# Patient Record
Sex: Male | Born: 1970 | Race: White | Hispanic: No | Smoking: Current every day smoker
Health system: Southern US, Community
[De-identification: ages and names within clinical notes are randomized; demographics above are authoritative.]

## PROBLEM LIST (undated history)

## (undated) DIAGNOSIS — C801 Malignant (primary) neoplasm, unspecified: Secondary | ICD-10-CM

## (undated) DIAGNOSIS — G8929 Other chronic pain: Secondary | ICD-10-CM

## (undated) DIAGNOSIS — G4730 Sleep apnea, unspecified: Secondary | ICD-10-CM

## (undated) DIAGNOSIS — M549 Dorsalgia, unspecified: Secondary | ICD-10-CM

## (undated) HISTORY — PX: KNEE ARTHROSCOPY W/ ACL RECONSTRUCTION AND PATELLA GRAFT: SHX1861

## (undated) HISTORY — PX: KNEE ARTHROSCOPY: SUR90

---

## 1997-11-10 ENCOUNTER — Emergency Department (HOSPITAL_COMMUNITY): Admission: EM | Admit: 1997-11-10 | Discharge: 1997-11-10 | Payer: Self-pay | Admitting: Emergency Medicine

## 1998-08-31 ENCOUNTER — Emergency Department (HOSPITAL_COMMUNITY): Admission: EM | Admit: 1998-08-31 | Discharge: 1998-08-31 | Payer: Self-pay | Admitting: Emergency Medicine

## 1998-08-31 ENCOUNTER — Encounter: Payer: Self-pay | Admitting: Emergency Medicine

## 2001-01-07 ENCOUNTER — Emergency Department (HOSPITAL_COMMUNITY): Admission: EM | Admit: 2001-01-07 | Discharge: 2001-01-07 | Payer: Self-pay | Admitting: Emergency Medicine

## 2001-01-08 ENCOUNTER — Emergency Department (HOSPITAL_COMMUNITY): Admission: EM | Admit: 2001-01-08 | Discharge: 2001-01-08 | Payer: Self-pay | Admitting: Emergency Medicine

## 2001-01-10 ENCOUNTER — Emergency Department (HOSPITAL_COMMUNITY): Admission: EM | Admit: 2001-01-10 | Discharge: 2001-01-10 | Payer: Self-pay | Admitting: Emergency Medicine

## 2001-06-03 ENCOUNTER — Encounter: Payer: Self-pay | Admitting: Emergency Medicine

## 2001-06-03 ENCOUNTER — Emergency Department (HOSPITAL_COMMUNITY): Admission: EM | Admit: 2001-06-03 | Discharge: 2001-06-03 | Payer: Self-pay | Admitting: Emergency Medicine

## 2002-06-14 ENCOUNTER — Emergency Department (HOSPITAL_COMMUNITY): Admission: EM | Admit: 2002-06-14 | Discharge: 2002-06-14 | Payer: Self-pay | Admitting: Emergency Medicine

## 2003-11-16 ENCOUNTER — Emergency Department (HOSPITAL_COMMUNITY): Admission: EM | Admit: 2003-11-16 | Discharge: 2003-11-16 | Payer: Self-pay | Admitting: Emergency Medicine

## 2004-03-14 ENCOUNTER — Emergency Department (HOSPITAL_COMMUNITY): Admission: EM | Admit: 2004-03-14 | Discharge: 2004-03-14 | Payer: Self-pay | Admitting: Emergency Medicine

## 2004-08-06 ENCOUNTER — Emergency Department (HOSPITAL_COMMUNITY): Admission: EM | Admit: 2004-08-06 | Discharge: 2004-08-06 | Payer: Self-pay | Admitting: Emergency Medicine

## 2005-03-08 ENCOUNTER — Emergency Department (HOSPITAL_COMMUNITY): Admission: EM | Admit: 2005-03-08 | Discharge: 2005-03-08 | Payer: Self-pay | Admitting: Emergency Medicine

## 2006-09-17 ENCOUNTER — Emergency Department (HOSPITAL_COMMUNITY): Admission: EM | Admit: 2006-09-17 | Discharge: 2006-09-17 | Payer: Self-pay | Admitting: Emergency Medicine

## 2006-09-19 ENCOUNTER — Emergency Department (HOSPITAL_COMMUNITY): Admission: EM | Admit: 2006-09-19 | Discharge: 2006-09-19 | Payer: Self-pay | Admitting: Emergency Medicine

## 2007-06-02 ENCOUNTER — Emergency Department (HOSPITAL_COMMUNITY): Admission: EM | Admit: 2007-06-02 | Discharge: 2007-06-03 | Payer: Self-pay | Admitting: Emergency Medicine

## 2007-06-09 ENCOUNTER — Emergency Department (HOSPITAL_COMMUNITY): Admission: EM | Admit: 2007-06-09 | Discharge: 2007-06-09 | Payer: Self-pay | Admitting: Emergency Medicine

## 2009-01-04 ENCOUNTER — Emergency Department (HOSPITAL_COMMUNITY): Admission: EM | Admit: 2009-01-04 | Discharge: 2009-01-04 | Payer: Self-pay | Admitting: Emergency Medicine

## 2009-01-10 ENCOUNTER — Emergency Department (HOSPITAL_COMMUNITY): Admission: EM | Admit: 2009-01-10 | Discharge: 2009-01-10 | Payer: Self-pay | Admitting: Emergency Medicine

## 2010-07-21 ENCOUNTER — Emergency Department (HOSPITAL_COMMUNITY)
Admission: EM | Admit: 2010-07-21 | Discharge: 2010-07-21 | Payer: Self-pay | Source: Home / Self Care | Admitting: Emergency Medicine

## 2010-10-02 LAB — COMPREHENSIVE METABOLIC PANEL
Alkaline Phosphatase: 73 U/L (ref 39–117)
Calcium: 9.7 mg/dL (ref 8.4–10.5)
GFR calc non Af Amer: >60 mL/min (ref >60)
Glucose, Bld: 88 mg/dL (ref 70–99)
Total Protein: 7 g/dL (ref 6.0–8.3)

## 2010-10-02 LAB — POCT CARDIAC MARKERS: CKMB, poc: <1 ng/mL — ABNORMAL LOW (ref 1.0–8.0)

## 2010-10-02 LAB — LIPASE, BLOOD: Lipase: 30 U/L (ref 11–59)

## 2010-12-30 ENCOUNTER — Emergency Department (HOSPITAL_COMMUNITY)
Admission: EM | Admit: 2010-12-30 | Discharge: 2010-12-30 | Disposition: A | Discharge status: Home / Self Care | Payer: Self-pay | Attending: Emergency Medicine | Admitting: Emergency Medicine

## 2010-12-30 ENCOUNTER — Emergency Department (HOSPITAL_COMMUNITY): Payer: Self-pay

## 2010-12-30 DIAGNOSIS — IMO0002 Reserved for concepts with insufficient information to code with codable children: Secondary | ICD-10-CM | POA: Insufficient documentation

## 2010-12-30 DIAGNOSIS — M7989 Other specified soft tissue disorders: Secondary | ICD-10-CM | POA: Insufficient documentation

## 2010-12-30 DIAGNOSIS — Y92009 Unspecified place in unspecified non-institutional (private) residence as the place of occurrence of the external cause: Secondary | ICD-10-CM | POA: Insufficient documentation

## 2010-12-30 DIAGNOSIS — Y9372 Activity, wrestling: Secondary | ICD-10-CM | POA: Insufficient documentation

## 2010-12-30 DIAGNOSIS — X500XXA Overexertion from strenuous movement or load, initial encounter: Secondary | ICD-10-CM | POA: Insufficient documentation

## 2011-01-04 ENCOUNTER — Emergency Department (HOSPITAL_COMMUNITY)
Admission: EM | Admit: 2011-01-04 | Discharge: 2011-01-04 | Disposition: A | Discharge status: Home / Self Care | Payer: Self-pay | Attending: Emergency Medicine | Admitting: Emergency Medicine

## 2011-01-04 DIAGNOSIS — M25569 Pain in unspecified knee: Secondary | ICD-10-CM | POA: Insufficient documentation

## 2011-01-04 DIAGNOSIS — Z760 Encounter for issue of repeat prescription: Secondary | ICD-10-CM | POA: Insufficient documentation

## 2011-03-12 ENCOUNTER — Other Ambulatory Visit (HOSPITAL_COMMUNITY): Payer: Self-pay

## 2011-03-15 ENCOUNTER — Encounter (HOSPITAL_COMMUNITY)
Admission: RE | Admit: 2011-03-15 | Discharge: 2011-03-15 | Disposition: A | Discharge status: Home / Self Care | Payer: Self-pay | Source: Ambulatory Visit | Attending: Orthopedic Surgery | Admitting: Orthopedic Surgery

## 2011-03-19 ENCOUNTER — Other Ambulatory Visit (HOSPITAL_COMMUNITY): Payer: Self-pay | Admitting: Orthopedic Surgery

## 2011-03-19 ENCOUNTER — Encounter (HOSPITAL_COMMUNITY)
Admission: RE | Admit: 2011-03-19 | Discharge: 2011-03-19 | Disposition: A | Discharge status: Home / Self Care | Payer: Self-pay | Source: Ambulatory Visit | Attending: Orthopedic Surgery | Admitting: Orthopedic Surgery

## 2011-03-19 DIAGNOSIS — Z01811 Encounter for preprocedural respiratory examination: Secondary | ICD-10-CM

## 2011-03-19 LAB — SURGICAL PCR SCREEN
MRSA, PCR: NEGATIVE
Staphylococcus aureus: NEGATIVE

## 2011-03-19 LAB — PROTIME-INR: Prothrombin Time: 14 seconds (ref 11.6–15.2)

## 2011-03-19 LAB — CBC
RBC: 5.72 MIL/uL (ref 4.22–5.81)
RDW: 13.6 % (ref 11.5–15.5)
WBC: 12.4 10*3/uL — ABNORMAL HIGH (ref 4.0–10.5)

## 2011-03-20 ENCOUNTER — Ambulatory Visit (HOSPITAL_COMMUNITY)
Admission: RE | Admit: 2011-03-20 | Discharge: 2011-03-20 | Disposition: A | Discharge status: Home / Self Care | Payer: Self-pay | Source: Ambulatory Visit | Attending: Orthopedic Surgery | Admitting: Orthopedic Surgery

## 2011-03-20 DIAGNOSIS — Z01812 Encounter for preprocedural laboratory examination: Secondary | ICD-10-CM | POA: Insufficient documentation

## 2011-03-20 DIAGNOSIS — M25569 Pain in unspecified knee: Secondary | ICD-10-CM | POA: Insufficient documentation

## 2011-03-20 DIAGNOSIS — Z01818 Encounter for other preprocedural examination: Secondary | ICD-10-CM | POA: Insufficient documentation

## 2011-03-20 DIAGNOSIS — M948X9 Other specified disorders of cartilage, unspecified sites: Secondary | ICD-10-CM | POA: Insufficient documentation

## 2011-04-04 NOTE — Op Note (Signed)
NAMERAKIM, MOONE NO.:  [id number]  MEDICAL RECORD NO.:  [id number]  LOCATION:                                 FACILITY:  PHYSICIAN:  Burnard Bunting, M.D.    DATE OF BIRTH:  July 28, 1970  DATE OF PROCEDURE:  03/20/2011 DATE OF DISCHARGE:                              OPERATIVE REPORT   PREOPERATIVE DIAGNOSIS:  Left knee pain.  POSTOPERATIVE DIAGNOSIS:  Left knee chondral defect, medial femoral condyle.  PROCEDURE:  Left knee diagnostic arthroscopy and debridement, medial femoral condyle.  SURGEON:  Burnard Bunting, MD  ASSISTANT:  None.  ANESTHESIA:  General.  ESTIMATED BLOOD LOSS:  Minimal.  INDICATIONS:  Caleb Aguirre is a 40 year old patient with left knee pain following a twisting injury and a popping, he presents now for operative management after explanation of risks and benefits.  OPERATIVE FINDINGS: 1. Examination under anesthesia:  Range of motion 0-140 with obvious     instability and varus-valgus stress at 0 and 30 degrees.  ACL, PCL     intact and a postoperative rotatory instability is noted. 2. Diagnostic arthroscopy:     a.     Intact patellofemoral compartment.     b.     Small 2-3 mm size chondral fragments within the medial and      lateral gutters.     c.     Intact lateral compartment articular cartilage and meniscus.     d.     Intact ACL, PCL.     e.     Intact medial compartment articular cartilage on the tibia      with intact medial meniscus, small chondral defect measuring about      5 x 5 mm, not full thickness on the medial femoral condyle with      mildly inflamed plica on that side.  PROCEDURE IN DETAIL:  The patient was brought to the operating room where general endotracheal anesthesia was induced, preoperative antibiotics were administered.  Right knee was prescrubbed with alcohol and Betadine which was allowed to air dry, prepped with DuraPrep solution, and draped in a sterile manner.  Time-out was called.   Left leg was elevated.  The anesthetic agent was used to anesthetize the portals.  Anterior, inferior, and lateral portals were established. Anterior, inferior, and medial portals were established under direct visualization.  Diagnostic arthroscopy was performed.  The patient had intact patellofemoral compartment.  No loose chondral flaps were noted. Mildly inflamed plica was present on the medial side which was resected. Small chondral defect was noted on the medial femoral condyle with a loose chondral flap, this was about approximately 0.5 cm x 0.5 2 cm. The loose chondral flap was debrided, small chondral fragments were present in the medial and lateral gutters which were removed.  The medial meniscus and lateral meniscus were intact.  No other chondral defects were noted on the femur or tibia.  ACL and PCL were intact. Posterior compartment was inspected and found to have no loose bodies. Following chondral debridement, plical debridement, knee joint was thoroughly irrigated.  Instruments were removed.  Portals were closedusing 3-0 nylon.  Solution of Marcaine and morphine was finally  injected into the knee.  The patient tolerated the procedure well without immediate complication.     Burnard Bunting, M.D.     GSD/MEDQ  D:  03/20/2011  T:  03/20/2011  Job:  [id number]  Electronically Signed by Reece Agar.  Abrham Maslowski M.D. on 04/04/2011 08:33:16 AM

## 2013-01-06 ENCOUNTER — Emergency Department (HOSPITAL_COMMUNITY): Payer: Self-pay

## 2013-01-06 ENCOUNTER — Encounter (HOSPITAL_COMMUNITY): Payer: Self-pay | Admitting: *Deleted

## 2013-01-06 ENCOUNTER — Emergency Department (HOSPITAL_COMMUNITY)
Admission: EM | Admit: 2013-01-06 | Discharge: 2013-01-06 | Disposition: A | Discharge status: Home / Self Care | Payer: Self-pay | Attending: Emergency Medicine | Admitting: Emergency Medicine

## 2013-01-06 DIAGNOSIS — M5431 Sciatica, right side: Secondary | ICD-10-CM

## 2013-01-06 DIAGNOSIS — Z9889 Other specified postprocedural states: Secondary | ICD-10-CM | POA: Insufficient documentation

## 2013-01-06 DIAGNOSIS — G8929 Other chronic pain: Secondary | ICD-10-CM | POA: Insufficient documentation

## 2013-01-06 DIAGNOSIS — R209 Unspecified disturbances of skin sensation: Secondary | ICD-10-CM | POA: Insufficient documentation

## 2013-01-06 DIAGNOSIS — Y9289 Other specified places as the place of occurrence of the external cause: Secondary | ICD-10-CM | POA: Insufficient documentation

## 2013-01-06 DIAGNOSIS — Y9389 Activity, other specified: Secondary | ICD-10-CM | POA: Insufficient documentation

## 2013-01-06 DIAGNOSIS — S99929A Unspecified injury of unspecified foot, initial encounter: Secondary | ICD-10-CM | POA: Insufficient documentation

## 2013-01-06 DIAGNOSIS — F17200 Nicotine dependence, unspecified, uncomplicated: Secondary | ICD-10-CM | POA: Insufficient documentation

## 2013-01-06 DIAGNOSIS — M5430 Sciatica, unspecified side: Secondary | ICD-10-CM | POA: Insufficient documentation

## 2013-01-06 DIAGNOSIS — W172XXA Fall into hole, initial encounter: Secondary | ICD-10-CM | POA: Insufficient documentation

## 2013-01-06 DIAGNOSIS — S8990XA Unspecified injury of unspecified lower leg, initial encounter: Secondary | ICD-10-CM | POA: Insufficient documentation

## 2013-01-06 HISTORY — DX: Dorsalgia, unspecified: M54.9

## 2013-01-06 HISTORY — DX: Other chronic pain: G89.29

## 2013-01-06 MED ORDER — METHOCARBAMOL 500 MG PO TABS
500.0000 mg | ORAL_TABLET | Freq: Once | ORAL | Status: AC
  Administered 2013-01-06: 500 mg via ORAL
  Filled 2013-01-06: qty 1

## 2013-01-06 MED ORDER — PREDNISONE 20 MG PO TABS
60.0000 mg | ORAL_TABLET | Freq: Once | ORAL | Status: AC
  Administered 2013-01-06: 60 mg via ORAL
  Filled 2013-01-06: qty 3

## 2013-01-06 MED ORDER — PREDNISONE 10 MG PO TABS: 20.0000 mg | ORAL_TABLET | Freq: Every day | ORAL | Status: DC

## 2013-01-06 MED ORDER — OXYCODONE-ACETAMINOPHEN 5-325 MG PO TABS: 2.0000 | ORAL_TABLET | ORAL | Status: DC | PRN

## 2013-01-06 MED ORDER — METHOCARBAMOL 500 MG PO TABS: 500.0000 mg | ORAL_TABLET | Freq: Two times a day (BID) | ORAL | Status: DC

## 2013-01-06 MED ORDER — OXYCODONE-ACETAMINOPHEN 5-325 MG PO TABS
1.0000 | ORAL_TABLET | Freq: Once | ORAL | Status: AC
  Administered 2013-01-06: 1 via ORAL
  Filled 2013-01-06: qty 1

## 2013-01-06 NOTE — ED Provider Notes (Signed)
History  This chart was scribed for non-physician practitioner working with Gerhard Munch, MD by Greggory Stallion, ED scribe. This patient was seen in room WTR5/WTR5 and the patient's care was started at 6:30 PM.  CSN: [id number]  Arrival date & time 01/06/13  1742    Chief Complaint  Patient presents with  . Fall  . Back Pain  . Numbness    Patient is a 42 y.o. male presenting with fall and back pain. The history is provided by the patient. No language interpreter was used.  Fall This is a new problem. The current episode started 12 to 24 hours ago. The problem has not changed since onset.Pertinent negatives include no chest pain, no abdominal pain and no shortness of breath. The symptoms are aggravated by walking. Nothing relieves the symptoms. He has tried nothing for the symptoms.  Back Pain Location:  Lumbar spine Pain severity:  Severe Pain is:  Same all the time Onset quality:  Gradual Duration:  1 day Timing:  Constant Relieved by:  None tried Worsened by:  Ambulation and movement Ineffective treatments:  None tried Associated symptoms: no abdominal pain, no chest pain, no fever and no numbness     HPI Comments: Caleb Aguirre is a 42 y.o. male with h/o chronic back pain who presents to the Emergency Department complaining of gradual onset, constant lower back pain with associated bilateral knee pain that happened last night when he stepped in a hole. He states when his foot got caught in the hole, he jarred his back and landed on his knees. Pt states that he has intermittent numbness from his right knee down to his toes. He states that his back pain becomes severe when he ambulates. Pt states he has had his knee elevated all day. 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 He states he has not taken anything for the pain. Pt denies fever, neck pain, sore throat, visual disturbance, CP, cough, SOB, abdominal pain, nausea, hematuria, emesis, diarrhea,  urinary symptoms, HA, weakness and rash as associated symptoms.     Past Medical History  Diagnosis Date  . Back pain, chronic     Past Surgical History  Procedure Laterality Date  . Knee arthroscopy      L knee    No family history on file.  History  Substance Use Topics  . Smoking status: Current Every Day Smoker -- 1.00 packs/day    Types: Cigarettes  . Smokeless tobacco: Not on file  . Alcohol Use: Yes     Comment: occa      Review of Systems  Constitutional: Negative for fever.  HENT: Negative for sore throat and neck pain.   Eyes: Negative for visual disturbance.  Respiratory: Negative for cough and shortness of breath.   Cardiovascular: Negative for chest pain.  Gastrointestinal: Negative for nausea, vomiting, abdominal pain and diarrhea.  Genitourinary: Negative for hematuria and difficulty urinating.  Musculoskeletal: Positive for back pain.  Skin: Negative for rash.  Neurological: Negative for numbness.  All other systems reviewed and are negative.    Allergies  Vicodin  Home Medications  No current outpatient prescriptions on file.  BP 135/100  Pulse 99  Temp(Src) 98.5 F (36.9 C) (Oral)  Resp 18  SpO2 98%  Physical Exam  Nursing note and vitals reviewed. Constitutional: He is oriented to person, place, and time. He appears well-developed and well-nourished. No distress.  HENT:  Head: Normocephalic and atraumatic.  Eyes: EOM are normal.  Neck: Neck supple. No tracheal deviation present.  Cardiovascular: Normal rate.   Pulmonary/Chest: Effort normal. No respiratory distress.  Musculoskeletal:  Lumbar and para lumbar spinal tenderness without crepitance or step off. Patellar reflexes intact. No foot drop. Increased pain with bilateral hip flexion. Positive leg raise, worse on right leg than left.  Neurological: He is alert and oriented to person, place, and time.  Skin: Skin is warm and dry.  Psychiatric: He has a normal mood and affect. His  behavior is normal.    ED Course  Procedures (including critical care time)  DIAGNOSTIC STUDIES: Oxygen Saturation is 98% on RA, normal by my interpretation.    COORDINATION OF CARE: 6:46 PM-Discussed treatment plan which includes steroids with pt at bedside and pt agreed to plan. Discussed with pt that he does not have a pinched nerve or spinal cord injury.   Labs Reviewed - No data to display Dg Lumbar Spine Complete  01/06/2013   *RADIOLOGY REPORT*  Clinical Data: Low back pain and tingling sensations in the left leg and foot following a fall.  LUMBAR SPINE - COMPLETE 4+ VIEW  Comparison: 01/04/2009.  Findings: Five non-rib bearing lumbar vertebrae.  Minimal anterior and mild posterior spur formation at the L5-S1 level.  No significant change in grade 1 retrolisthesis at the L5-S1 level. Anterior spur formation in the lower thoracic spine.  No fractures or acute subluxations.  No pars defects.  IMPRESSION:  1.  No acute abnormality. 2.  Degenerative changes, as described above.  The   Original Report Authenticated By: Beckie Salts, M.D.     1. Sciatica, right       MDM  BP 135/100  Pulse 99  Temp(Src) 98.5 F (36.9 C) (Oral)  Resp 18  SpO2 98%  I have reviewed nursing notes and vital signs. I personally reviewed the imaging tests through PACS system  I reviewed available ER/hospitalization records thought the EMR  I personally performed the services described in this documentation, which was scribed in my presence. The recorded information has been reviewed and is accurate.    Fayrene Helper, PA-C 01/06/13 [phone]

## 2013-01-06 NOTE — ED Notes (Signed)
Pt reports stepping in a hole last night, landed on both knees, jarring his lower back.  Pt has chronic back pain.  But reports R knee numbness all the way down to his toes.  Pt reports numbness comes and goes. Pt reports pain in his back becomes severe when ambulating.

## 2013-01-06 NOTE — ED Notes (Signed)
Pt states he has a ride home

## 2013-01-06 NOTE — ED Provider Notes (Signed)
  Medical screening examination/treatment/procedure(s) were performed by non-physician practitioner and as supervising physician I was immediately available for consultation/collaboration.    Soleil Mas, MD 01/06/13 2348 

## 2013-04-22 ENCOUNTER — Emergency Department (HOSPITAL_COMMUNITY)
Admission: EM | Admit: 2013-04-22 | Discharge: 2013-04-22 | Disposition: A | Discharge status: Home / Self Care | Payer: Self-pay | Attending: Emergency Medicine | Admitting: Emergency Medicine

## 2013-04-22 ENCOUNTER — Encounter (HOSPITAL_COMMUNITY): Payer: Self-pay | Admitting: Emergency Medicine

## 2013-04-22 DIAGNOSIS — F17200 Nicotine dependence, unspecified, uncomplicated: Secondary | ICD-10-CM | POA: Insufficient documentation

## 2013-04-22 DIAGNOSIS — Z79899 Other long term (current) drug therapy: Secondary | ICD-10-CM | POA: Insufficient documentation

## 2013-04-22 DIAGNOSIS — G8929 Other chronic pain: Secondary | ICD-10-CM | POA: Insufficient documentation

## 2013-04-22 DIAGNOSIS — M545 Low back pain, unspecified: Secondary | ICD-10-CM | POA: Insufficient documentation

## 2013-04-22 DIAGNOSIS — M549 Dorsalgia, unspecified: Secondary | ICD-10-CM

## 2013-04-22 MED ORDER — METHOCARBAMOL 500 MG PO TABS: 500.0000 mg | ORAL_TABLET | Freq: Two times a day (BID) | ORAL | Status: DC

## 2013-04-22 MED ORDER — KETOROLAC TROMETHAMINE 60 MG/2ML IM SOLN
60.0000 mg | Freq: Once | INTRAMUSCULAR | Status: AC
  Administered 2013-04-22: 60 mg via INTRAMUSCULAR
  Filled 2013-04-22: qty 2

## 2013-04-22 MED ORDER — OXYCODONE-ACETAMINOPHEN 5-325 MG PO TABS: 2.0000 | ORAL_TABLET | Freq: Four times a day (QID) | ORAL | Status: DC | PRN

## 2013-04-22 NOTE — ED Notes (Signed)
Pt states hx of back problems.  States he was helping someone move 2 days ago and "hurt his back".

## 2013-04-22 NOTE — ED Provider Notes (Signed)
Medical screening examination/treatment/procedure(s) were performed by non-physician practitioner and as supervising physician I was immediately available for consultation/collaboration.     Zymarion Favorite R Nuvia Hileman, MD 04/22/13 2357 

## 2013-04-22 NOTE — ED Provider Notes (Signed)
CSN: [id number]     Arrival date & time 04/22/13  1426 History  This chart was scribed for Roxy Horseman, PA, working with Celene Kras, MD, by Ardelia Mems ED Scribe. This patient was seen in room WTR5/WTR5 and the patient's care was started at 4:18 PM.  Chief Complaint  Patient presents with  . Back Pain    The history is provided by the patient. No language interpreter was used.    HPI Comments: Caleb Aguirre is a 42 y.o. male with a history of chronic back pain who presents to the Emergency Department complaining of a flare-up of his chronic lower back pain onset 2 days ago when he was lifting heavy boxes. 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 He states that he has flare-ups 2-3 times a year. He states that in the past he has had radiation of back pain to his legs, but denies radiation currently. He states that in the past he has received X-rays in the ED of his back which have always been negative. He states that he normally receives anti-inflammatory and a prescription for Percocet with relief. He states that he has no kidney problems, DM or any chronic medical conditions. He denies bowel or bladder incontinence, saddle anesthesia or any other pain or symptoms. He states that he did not drive to the ED.    Past Medical History  Diagnosis Date  . Back pain, chronic    Past Surgical History  Procedure Laterality Date  . Knee arthroscopy      L knee   No family history on file. History  Substance Use Topics  . Smoking status: Current Every Day Smoker -- 1.00 packs/day    Types: Cigarettes  . Smokeless tobacco: Not on file  . Alcohol Use: Yes     Comment: occa    Review of Systems  All other systems reviewed and are negative.  A complete 10 system review of systems was obtained and all systems are negative except as noted in the HPI and PMH.   Allergies  Vicodin  Home Medications   Current Outpatient Rx  Name  Route  Sig  Dispense   Refill  . methocarbamol (ROBAXIN) 500 MG tablet   Oral   Take 1 tablet (500 mg total) by mouth 2 (two) times daily.   20 tablet   0   . oxyCODONE-acetaminophen (PERCOCET/ROXICET) 5-325 MG per tablet   Oral   Take 2 tablets by mouth every 4 (four) hours as needed for pain.   15 tablet   0   . predniSONE (DELTASONE) 10 MG tablet   Oral   Take 2 tablets (20 mg total) by mouth daily.   15 tablet   0    Triage Vitals: BP 127/85  Pulse 115  Temp(Src) 97.6 F (36.4 C) (Oral)  Resp 16  SpO2 93%  Physical Exam  Nursing note and vitals reviewed. Constitutional: He is oriented to person, place, and time. He appears well-developed and well-nourished. No distress.  HENT:  Head: Normocephalic and atraumatic.  Eyes: Conjunctivae and EOM are normal. Right eye exhibits no discharge. Left eye exhibits no discharge. No scleral icterus.  Neck: Normal range of motion. Neck supple. No tracheal deviation present.  Cardiovascular: Normal rate, regular rhythm and normal heart sounds.  Exam reveals no gallop and no friction rub.   No murmur heard. Pulmonary/Chest: Effort normal and breath sounds normal. No respiratory distress. He has no wheezes.  Abdominal:  Soft. He exhibits no distension. There is no tenderness.  Musculoskeletal: Normal range of motion.  Lumbar paraspinal muscles tender to palpation, no bony tenderness, step-offs, or gross abnormality or deformity of spine, patient is able to ambulate, moves all extremities   Neurological: He is alert and oriented to person, place, and time. He has normal reflexes.  Sensation and strength intact bilaterally Symmetrical reflexes  Skin: Skin is warm and dry. He is not diaphoretic.  Psychiatric: He has a normal mood and affect. His behavior is normal. Judgment and thought content normal.    ED Course  Procedures (including critical care time)  DIAGNOSTIC STUDIES: Oxygen Saturation is 93% on RA, adequate by my interpretation.     COORDINATION OF CARE: 4:24 PM- Discussed plan for pt to receive a shot of anti-inflammatory medication and Valium, along with plan to be discharged with a small amount of Percocet. Advised pt to consider establishing care with a chronic pain doctor if his back pain continues to be an issue. Pt is requesting a referral to a chronic pain doctor. Pt advised of plan for treatment and pt agrees.  Labs Review Labs Reviewed - No data to display Imaging Review No results found.  MDM   1. Back pain    Patient with back pain.  No neurological deficits and normal neuro exam.  Patient can walk but states is painful.  No loss of bowel or bladder control.  No concern for cauda equina.  No fever, night sweats, weight loss, h/o cancer, IVDU.  RICE protocol and pain medicine indicated and discussed with patient.  Filed Vitals:   04/22/13 1633  BP:   Pulse: 97  Temp:   Resp:      I personally performed the services described in this documentation, which was scribed in my presence. The recorded information has been reviewed and is accurate.       Roxy Horseman, PA-C 04/22/13 1640

## 2013-05-29 ENCOUNTER — Observation Stay (HOSPITAL_COMMUNITY): Payer: Self-pay

## 2013-05-29 ENCOUNTER — Inpatient Hospital Stay (HOSPITAL_COMMUNITY)
Admission: EM | Admit: 2013-05-29 | Discharge: 2013-05-30 | DRG: 042 | Disposition: A | Discharge status: Home / Self Care | Payer: Self-pay | Attending: Surgery | Admitting: Surgery

## 2013-05-29 ENCOUNTER — Emergency Department (HOSPITAL_COMMUNITY): Payer: Self-pay

## 2013-05-29 ENCOUNTER — Encounter (HOSPITAL_COMMUNITY): Payer: Self-pay | Admitting: Radiology

## 2013-05-29 DIAGNOSIS — S81809A Unspecified open wound, unspecified lower leg, initial encounter: Secondary | ICD-10-CM

## 2013-05-29 DIAGNOSIS — S1190XA Unspecified open wound of unspecified part of neck, initial encounter: Secondary | ICD-10-CM

## 2013-05-29 DIAGNOSIS — S0181XA Laceration without foreign body of other part of head, initial encounter: Secondary | ICD-10-CM

## 2013-05-29 DIAGNOSIS — T07XXXA Unspecified multiple injuries, initial encounter: Secondary | ICD-10-CM

## 2013-05-29 DIAGNOSIS — S1191XA Laceration without foreign body of unspecified part of neck, initial encounter: Secondary | ICD-10-CM

## 2013-05-29 DIAGNOSIS — S81009A Unspecified open wound, unspecified knee, initial encounter: Secondary | ICD-10-CM

## 2013-05-29 DIAGNOSIS — S8492XA Injury of unspecified nerve at lower leg level, left leg, initial encounter: Secondary | ICD-10-CM | POA: Diagnosis present

## 2013-05-29 DIAGNOSIS — S01409A Unspecified open wound of unspecified cheek and temporomandibular area, initial encounter: Secondary | ICD-10-CM | POA: Diagnosis present

## 2013-05-29 DIAGNOSIS — F1010 Alcohol abuse, uncomplicated: Secondary | ICD-10-CM | POA: Diagnosis present

## 2013-05-29 DIAGNOSIS — S81812A Laceration without foreign body, left lower leg, initial encounter: Secondary | ICD-10-CM

## 2013-05-29 DIAGNOSIS — S91009A Unspecified open wound, unspecified ankle, initial encounter: Secondary | ICD-10-CM

## 2013-05-29 DIAGNOSIS — S8410XA Injury of peroneal nerve at lower leg level, unspecified leg, initial encounter: Principal | ICD-10-CM | POA: Diagnosis present

## 2013-05-29 DIAGNOSIS — F17200 Nicotine dependence, unspecified, uncomplicated: Secondary | ICD-10-CM | POA: Diagnosis present

## 2013-05-29 HISTORY — DX: Sleep apnea, unspecified: G47.30

## 2013-05-29 LAB — CBC
Hemoglobin: 16.6 g/dL (ref 13.0–17.0)
MCH: 31 pg (ref 26.0–34.0)
MCHC: 35.9 g/dL (ref 30.0–36.0)
Platelets: 195 10*3/uL (ref 150–400)
RBC: 5.35 MIL/uL (ref 4.22–5.81)
WBC: 9.6 10*3/uL (ref 4.0–10.5)

## 2013-05-29 LAB — COMPREHENSIVE METABOLIC PANEL
ALT: 18 U/L (ref 0–53)
CO2: 23 mEq/L (ref 19–32)
Calcium: 8.9 mg/dL (ref 8.4–10.5)
Chloride: 101 mEq/L (ref 96–112)
Creatinine, Ser: 0.94 mg/dL (ref 0.50–1.35)
GFR calc Af Amer: >90 mL/min (ref >90)
GFR calc non Af Amer: >90 mL/min (ref >90)
Glucose, Bld: 92 mg/dL (ref 70–99)
Sodium: 140 mEq/L (ref 135–145)
Total Bilirubin: 0.3 mg/dL (ref 0.3–1.2)
Total Protein: 7.2 g/dL (ref 6.0–8.3)

## 2013-05-29 LAB — PROTIME-INR
INR: 1.03 (ref 0.00–1.49)
Prothrombin Time: 13.3 seconds (ref 11.6–15.2)

## 2013-05-29 LAB — POCT I-STAT, CHEM 8
Calcium, Ion: 1.05 mmol/L — ABNORMAL LOW (ref 1.12–1.23)
Creatinine, Ser: 1.4 mg/dL — ABNORMAL HIGH (ref 0.50–1.35)
Glucose, Bld: 88 mg/dL (ref 70–99)
HCT: 47 % (ref 39.0–52.0)
Hemoglobin: 16 g/dL (ref 13.0–17.0)
Potassium: 3.6 mEq/L (ref 3.5–5.1)
TCO2: 21 mmol/L (ref 0–100)

## 2013-05-29 LAB — CG4 I-STAT (LACTIC ACID): Lactic Acid, Venous: 2.64 mmol/L — ABNORMAL HIGH (ref 0.5–2.2)

## 2013-05-29 MED ORDER — ONDANSETRON HCL 4 MG PO TABS: 4.0000 mg | ORAL_TABLET | Freq: Four times a day (QID) | ORAL | Status: DC | PRN

## 2013-05-29 MED ORDER — ONDANSETRON HCL 4 MG/2ML IJ SOLN: 4.0000 mg | Freq: Four times a day (QID) | INTRAMUSCULAR | Status: DC | PRN

## 2013-05-29 MED ORDER — PANTOPRAZOLE SODIUM 40 MG PO TBEC: 40.0000 mg | DELAYED_RELEASE_TABLET | Freq: Every day | ORAL | Status: DC

## 2013-05-29 MED ORDER — IOHEXOL 350 MG/ML SOLN
150.0000 mL | Freq: Once | INTRAVENOUS | Status: AC | PRN
  Administered 2013-05-29: 125 mL via INTRAVENOUS

## 2013-05-29 MED ORDER — PANTOPRAZOLE SODIUM 40 MG IV SOLR
40.0000 mg | Freq: Every day | INTRAVENOUS | Status: DC
  Administered 2013-05-30: 40 mg via INTRAVENOUS
  Filled 2013-05-29 (×2): qty 40

## 2013-05-29 MED ORDER — FENTANYL CITRATE 0.05 MG/ML IJ SOLN
50.0000 ug | Freq: Once | INTRAMUSCULAR | Status: AC
  Administered 2013-05-29: 50 ug via INTRAVENOUS
  Filled 2013-05-29: qty 2

## 2013-05-29 MED ORDER — SODIUM CHLORIDE 0.9 % IV BOLUS (SEPSIS)
1000.0000 mL | Freq: Once | INTRAVENOUS | Status: AC
  Administered 2013-05-29: 1000 mL via INTRAVENOUS
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
## 2013-05-29 NOTE — Progress Notes (Signed)
Chaplain responded to page from ED. No family present.

## 2013-05-29 NOTE — ED Notes (Signed)
Fast exam negative
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
## 2013-05-29 NOTE — ED Notes (Signed)
MD at bedside. 

## 2013-05-29 NOTE — ED Notes (Signed)
Pt back in the ED. Move to room 15

## 2013-05-29 NOTE — ED Notes (Signed)
Pt transported to Xray. 
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
## 2013-05-29 NOTE — ED Notes (Signed)
Pt transported to CT ?

## 2013-05-30 ENCOUNTER — Observation Stay (HOSPITAL_COMMUNITY): Payer: Self-pay | Admitting: Certified Registered Nurse Anesthetist

## 2013-05-30 ENCOUNTER — Encounter (HOSPITAL_COMMUNITY): Payer: Self-pay | Admitting: Certified Registered Nurse Anesthetist

## 2013-05-30 ENCOUNTER — Encounter (HOSPITAL_COMMUNITY): Admission: EM | Disposition: A | Discharge status: Home / Self Care | Payer: Self-pay | Source: Home / Self Care

## 2013-05-30 DIAGNOSIS — S0181XA Laceration without foreign body of other part of head, initial encounter: Secondary | ICD-10-CM

## 2013-05-30 DIAGNOSIS — S1191XA Laceration without foreign body of unspecified part of neck, initial encounter: Secondary | ICD-10-CM

## 2013-05-30 DIAGNOSIS — S8492XA Injury of unspecified nerve at lower leg level, left leg, initial encounter: Secondary | ICD-10-CM | POA: Diagnosis present

## 2013-05-30 DIAGNOSIS — S81812A Laceration without foreign body, left lower leg, initial encounter: Secondary | ICD-10-CM

## 2013-05-30 HISTORY — PX: I&D EXTREMITY: SHX5045

## 2013-05-30 LAB — TYPE AND SCREEN
ABO/RH(D): AB NEG
Antibody Screen: NEGATIVE
Unit division: 0

## 2013-05-30 LAB — CBC
Hemoglobin: 14.8 g/dL (ref 13.0–17.0)
MCH: 30.1 pg (ref 26.0–34.0)
MCHC: 34.3 g/dL (ref 30.0–36.0)
MCV: 88 fL (ref 78.0–100.0)
RDW: 13.6 % (ref 11.5–15.5)

## 2013-05-30 LAB — URINALYSIS, ROUTINE W REFLEX MICROSCOPIC
Bilirubin Urine: NEGATIVE
Nitrite: NEGATIVE
Protein, ur: NEGATIVE mg/dL
Specific Gravity, Urine: 1.027 (ref 1.005–1.030)
Urobilinogen, UA: 0.2 mg/dL (ref 0.0–1.0)

## 2013-05-30 LAB — URINE MICROSCOPIC-ADD ON

## 2013-05-30 LAB — MRSA PCR SCREENING: MRSA by PCR: NEGATIVE

## 2013-05-30 SURGERY — IRRIGATION AND DEBRIDEMENT EXTREMITY
Anesthesia: General | Site: Leg Lower | Laterality: Left | Wound class: Contaminated

## 2013-05-30 MED ORDER — SUCCINYLCHOLINE CHLORIDE 20 MG/ML IJ SOLN
INTRAMUSCULAR | Status: DC | PRN
  Administered 2013-05-30: 130 mg via INTRAVENOUS

## 2013-05-30 MED ORDER — CEFAZOLIN SODIUM 1-5 GM-% IV SOLN
INTRAVENOUS | Status: AC
  Filled 2013-05-30: qty 100

## 2013-05-30 MED ORDER — NAPROXEN 500 MG PO TABS
500.0000 mg | ORAL_TABLET | Freq: Two times a day (BID) | ORAL | Status: DC
  Filled 2013-05-30 (×2): qty 1

## 2013-05-30 MED ORDER — SODIUM CHLORIDE 0.9 % IR SOLN
Status: DC | PRN
  Administered 2013-05-30: 3000 mL

## 2013-05-30 MED ORDER — ONDANSETRON HCL 4 MG/2ML IJ SOLN
INTRAMUSCULAR | Status: DC | PRN
  Administered 2013-05-30: 4 mg via INTRAVENOUS

## 2013-05-30 MED ORDER — OXYCODONE-ACETAMINOPHEN 10-325 MG PO TABS: 1.0000 | ORAL_TABLET | ORAL | Status: DC | PRN

## 2013-05-30 MED ORDER — 0.9 % SODIUM CHLORIDE (POUR BTL) OPTIME
TOPICAL | Status: DC | PRN
  Administered 2013-05-30: 1000 mL

## 2013-05-30 MED ORDER — HYDROMORPHONE HCL PF 1 MG/ML IJ SOLN: 0.5000 mg | INTRAMUSCULAR | Status: DC | PRN

## 2013-05-30 MED ORDER — HYDROMORPHONE HCL PF 1 MG/ML IJ SOLN
0.2500 mg | INTRAMUSCULAR | Status: DC | PRN
  Administered 2013-05-30 (×3): 0.5 mg via INTRAVENOUS

## 2013-05-30 MED ORDER — FENTANYL CITRATE 0.05 MG/ML IJ SOLN
INTRAMUSCULAR | Status: DC | PRN
  Administered 2013-05-30 (×2): 50 ug via INTRAVENOUS
  Administered 2013-05-30 (×3): 100 ug via INTRAVENOUS

## 2013-05-30 MED ORDER — DEXTROSE 5 % IV SOLN
INTRAVENOUS | Status: DC | PRN
  Administered 2013-05-30: 01:00:00 via INTRAVENOUS

## 2013-05-30 MED ORDER — HYDROMORPHONE HCL PF 1 MG/ML IJ SOLN
INTRAMUSCULAR | Status: AC
  Filled 2013-05-30: qty 1

## 2013-05-30 MED ORDER — OXYCODONE HCL 5 MG/5ML PO SOLN: 5.0000 mg | Freq: Once | ORAL | Status: DC | PRN

## 2013-05-30 MED ORDER — DEXAMETHASONE SODIUM PHOSPHATE 4 MG/ML IJ SOLN
INTRAMUSCULAR | Status: DC | PRN
  Administered 2013-05-30: 4 mg via INTRAVENOUS

## 2013-05-30 MED ORDER — MIDAZOLAM HCL 5 MG/5ML IJ SOLN
INTRAMUSCULAR | Status: DC | PRN
  Administered 2013-05-30: 2 mg via INTRAVENOUS

## 2013-05-30 MED ORDER — LIDOCAINE HCL (CARDIAC) 20 MG/ML IV SOLN
INTRAVENOUS | Status: DC | PRN
  Administered 2013-05-30: 60 mg via INTRAVENOUS

## 2013-05-30 MED ORDER — ONDANSETRON HCL 4 MG/2ML IJ SOLN: 4.0000 mg | Freq: Four times a day (QID) | INTRAMUSCULAR | Status: DC | PRN

## 2013-05-30 MED ORDER — METOCLOPRAMIDE HCL 5 MG/ML IJ SOLN
5.0000 mg | Freq: Three times a day (TID) | INTRAMUSCULAR | Status: DC | PRN
  Filled 2013-05-30: qty 2

## 2013-05-30 MED ORDER — NAPROXEN 500 MG PO TABS: 500.0000 mg | ORAL_TABLET | Freq: Two times a day (BID) | ORAL | Status: DC

## 2013-05-30 MED ORDER — BACITRACIN ZINC 500 UNIT/GM EX OINT
TOPICAL_OINTMENT | Freq: Two times a day (BID) | CUTANEOUS | Status: DC
  Filled 2013-05-30: qty 15
  Filled 2013-05-30: qty 28.35

## 2013-05-30 MED ORDER — METOCLOPRAMIDE HCL 5 MG PO TABS
5.0000 mg | ORAL_TABLET | Freq: Three times a day (TID) | ORAL | Status: DC | PRN
  Filled 2013-05-30: qty 2

## 2013-05-30 MED ORDER — ONDANSETRON HCL 4 MG PO TABS: 4.0000 mg | ORAL_TABLET | Freq: Four times a day (QID) | ORAL | Status: DC | PRN

## 2013-05-30 MED ORDER — METHOCARBAMOL 100 MG/ML IJ SOLN: 500.0000 mg | Freq: Four times a day (QID) | INTRAVENOUS | Status: DC | PRN

## 2013-05-30 MED ORDER — PROPOFOL 10 MG/ML IV BOLUS
INTRAVENOUS | Status: DC | PRN
  Administered 2013-05-30: 200 mg via INTRAVENOUS
  Administered 2013-05-30: 50 mg via INTRAVENOUS

## 2013-05-30 MED ORDER — OXYCODONE HCL 5 MG PO TABS: 5.0000 mg | ORAL_TABLET | Freq: Once | ORAL | Status: DC | PRN

## 2013-05-30 MED ORDER — CEFAZOLIN SODIUM-DEXTROSE 2-3 GM-% IV SOLR
2.0000 g | Freq: Four times a day (QID) | INTRAVENOUS | Status: DC
  Administered 2013-05-30 (×2): 2 g via INTRAVENOUS
  Filled 2013-05-30 (×3): qty 50

## 2013-05-30 MED ORDER — ROCURONIUM BROMIDE 100 MG/10ML IV SOLN
INTRAVENOUS | Status: DC | PRN
  Administered 2013-05-30 (×2): 10 mg via INTRAVENOUS
  Administered 2013-05-30: 30 mg via INTRAVENOUS

## 2013-05-30 MED ORDER — CEFAZOLIN SODIUM-DEXTROSE 2-3 GM-% IV SOLR
INTRAVENOUS | Status: DC | PRN
  Administered 2013-05-30: 2 g via INTRAVENOUS

## 2013-05-30 MED ORDER — ARTIFICIAL TEARS OP OINT
TOPICAL_OINTMENT | OPHTHALMIC | Status: DC | PRN
  Administered 2013-05-30: 1 via OPHTHALMIC

## 2013-05-30 MED ORDER — LACTATED RINGERS IV SOLN
INTRAVENOUS | Status: DC
  Administered 2013-05-30: 1000 mL via INTRAVENOUS

## 2013-05-30 MED ORDER — NEOSTIGMINE METHYLSULFATE 1 MG/ML IJ SOLN
INTRAMUSCULAR | Status: DC | PRN
  Administered 2013-05-30: 5 mg via INTRAVENOUS

## 2013-05-30 MED ORDER — METHOCARBAMOL 500 MG PO TABS
500.0000 mg | ORAL_TABLET | Freq: Four times a day (QID) | ORAL | Status: DC | PRN
  Filled 2013-05-30: qty 1

## 2013-05-30 MED ORDER — POTASSIUM CHLORIDE 10 MEQ/100ML IV SOLN: 10.0000 meq | INTRAVENOUS | Status: DC

## 2013-05-30 MED ORDER — LACTATED RINGERS IV SOLN
INTRAVENOUS | Status: DC | PRN
  Administered 2013-05-30 (×2): via INTRAVENOUS

## 2013-05-30 MED ORDER — OXYCODONE-ACETAMINOPHEN 5-325 MG PO TABS
1.0000 | ORAL_TABLET | ORAL | Status: DC | PRN
  Administered 2013-05-30: 2 via ORAL
  Filled 2013-05-30: qty 2

## 2013-05-30 MED ORDER — OXYCODONE HCL 5 MG PO TABS
10.0000 mg | ORAL_TABLET | ORAL | Status: DC | PRN
  Administered 2013-05-30 (×2): 15 mg via ORAL
  Filled 2013-05-30: qty 3
  Filled 2013-05-30: qty 4

## 2013-05-30 MED ORDER — GLYCOPYRROLATE 0.2 MG/ML IJ SOLN
INTRAMUSCULAR | Status: DC | PRN
  Administered 2013-05-30: .7 mg via INTRAVENOUS

## 2013-05-30 SURGICAL SUPPLY — 57 items
BANDAGE ELASTIC 4 VELCRO ST LF (GAUZE/BANDAGES/DRESSINGS) ×2 IMPLANT
BANDAGE ELASTIC 6 VELCRO ST LF (GAUZE/BANDAGES/DRESSINGS) ×2 IMPLANT
BANDAGE GAUZE ELAST BULKY 4 IN (GAUZE/BANDAGES/DRESSINGS) ×2 IMPLANT
BNDG COHESIVE 4X5 TAN STRL (GAUZE/BANDAGES/DRESSINGS) ×2 IMPLANT
BRUSH SCRUB DISP (MISCELLANEOUS) ×2 IMPLANT
CLOTH BEACON ORANGE TIMEOUT ST (SAFETY) ×2 IMPLANT
COVER SURGICAL LIGHT HANDLE (MISCELLANEOUS) ×2 IMPLANT
CUFF TOURNIQUET SINGLE 18IN (TOURNIQUET CUFF) IMPLANT
CUFF TOURNIQUET SINGLE 24IN (TOURNIQUET CUFF) IMPLANT
CUFF TOURNIQUET SINGLE 34IN LL (TOURNIQUET CUFF) IMPLANT
CUFF TOURNIQUET SINGLE 44IN (TOURNIQUET CUFF) IMPLANT
DRAPE U-SHAPE 47X51 STRL (DRAPES) ×2 IMPLANT
DRSG ADAPTIC 3X8 NADH LF (GAUZE/BANDAGES/DRESSINGS) ×2 IMPLANT
DRSG PAD ABDOMINAL 8X10 ST (GAUZE/BANDAGES/DRESSINGS) ×2 IMPLANT
DURAPREP 26ML APPLICATOR (WOUND CARE) ×2 IMPLANT
ELECT REM PT RETURN 9FT ADLT (ELECTROSURGICAL)
ELECTRODE REM PT RTRN 9FT ADLT (ELECTROSURGICAL) IMPLANT
FACESHIELD LNG OPTICON STERILE (SAFETY) ×2 IMPLANT
GLOVE BIOGEL PI ORTHO PRO 7.5 (GLOVE) ×1
GLOVE BIOGEL PI ORTHO PRO SZ8 (GLOVE) ×1
GLOVE ORTHO TXT STRL SZ7.5 (GLOVE) ×2 IMPLANT
GLOVE PI ORTHO PRO STRL 7.5 (GLOVE) ×1 IMPLANT
GLOVE PI ORTHO PRO STRL SZ8 (GLOVE) ×1 IMPLANT
GLOVE SURG ORTHO 8.5 STRL (GLOVE) ×2 IMPLANT
GOWN STRL NON-REIN LRG LVL3 (GOWN DISPOSABLE) ×2 IMPLANT
GOWN STRL REIN XL XLG (GOWN DISPOSABLE) ×4 IMPLANT
HANDPIECE INTERPULSE COAX TIP (DISPOSABLE)
KIT BASIN OR (CUSTOM PROCEDURE TRAY) ×2 IMPLANT
KIT ROOM TURNOVER OR (KITS) ×2 IMPLANT
MANIFOLD NEPTUNE II (INSTRUMENTS) ×2 IMPLANT
NS IRRIG 1000ML POUR BTL (IV SOLUTION) ×2 IMPLANT
PACK ORTHO EXTREMITY (CUSTOM PROCEDURE TRAY) ×2 IMPLANT
PAD ARMBOARD 7.5X6 YLW CONV (MISCELLANEOUS) ×4 IMPLANT
PAD CAST 4YDX4 CTTN HI CHSV (CAST SUPPLIES) ×1 IMPLANT
PADDING CAST COTTON 4X4 STRL (CAST SUPPLIES) ×1
PADDING CAST COTTON 6X4 STRL (CAST SUPPLIES) ×2 IMPLANT
PENCIL BUTTON HOLSTER BLD 10FT (ELECTRODE) IMPLANT
SET HNDPC FAN SPRY TIP SCT (DISPOSABLE) IMPLANT
SPEAR EYE SURG WECK-CEL (MISCELLANEOUS) ×2 IMPLANT
SPLINT PLASTER CAST XFAST 5X30 (CAST SUPPLIES) ×1 IMPLANT
SPLINT PLASTER XFAST SET 5X30 (CAST SUPPLIES) ×1
SPONGE GAUZE 4X4 12PLY (GAUZE/BANDAGES/DRESSINGS) ×2 IMPLANT
SPONGE LAP 18X18 X RAY DECT (DISPOSABLE) ×2 IMPLANT
SPONGE LAP 4X18 X RAY DECT (DISPOSABLE) ×2 IMPLANT
STOCKINETTE IMPERVIOUS 9X36 MD (GAUZE/BANDAGES/DRESSINGS) IMPLANT
SUT ETHILON 2 0 FS 18 (SUTURE) ×4 IMPLANT
SUT PDS AB 2-0 CT1 27 (SUTURE) ×6 IMPLANT
SUT PROLENE 5 0 PS 2 (SUTURE) ×2 IMPLANT
SUT VIC AB 2-0 CT1 27 (SUTURE)
SUT VIC AB 2-0 CT1 TAPERPNT 27 (SUTURE) IMPLANT
TOWEL OR 17X24 6PK STRL BLUE (TOWEL DISPOSABLE) ×2 IMPLANT
TOWEL OR 17X26 10 PK STRL BLUE (TOWEL DISPOSABLE) ×2 IMPLANT
TUBE ANAEROBIC SPECIMEN COL (MISCELLANEOUS) IMPLANT
TUBE CONNECTING 12X1/4 (SUCTIONS) ×2 IMPLANT
UNDERPAD 30X30 INCONTINENT (UNDERPADS AND DIAPERS) ×4 IMPLANT
WATER STERILE IRR 1000ML POUR (IV SOLUTION) IMPLANT
YANKAUER SUCT BULB TIP NO VENT (SUCTIONS) ×2 IMPLANT

## 2013-05-30 NOTE — Progress Notes (Signed)
Case management aware of patient's request to get prescription coverage. Prescriptions given. Discharge instructions given. IV out without incident. Patient anxious to be discharged. Patient aware of follow-up with orthopedics and pain management. Sherlyn Lees, RN

## 2013-05-30 NOTE — Progress Notes (Signed)
Subjective: Day of Surgery Procedure(s) (LRB): IRRIGATION AND DEBRIDEMENT left leg; exploration, repair left leg laceration with nerve repair. (Left) Patient reports pain as 5 on 0-10 scale.    Objective: Vital signs in last 24 hours: Temp:  [97.6 F (36.4 C)-99 F (37.2 C)] 98.5 F (36.9 C) (11/08 0800) Pulse Rate:  [92-113] 113 (11/08 0800) Resp:  [13-22] 16 (11/08 0800) BP: (109-145)/(2-98) 116/68 mmHg (11/08 0800) SpO2:  [92 %-100 %] 94 % (11/08 0800) Weight:  [99.791 kg (220 lb)-102.6 kg (226 lb 3.1 oz)] 102.6 kg (226 lb 3.1 oz) (11/08 0400)  Intake/Output from previous day: 11/07 0701 - 11/08 0700 In: 4000 [I.V.:4000] Out: 1500 [Urine:1450; Blood:50] Intake/Output this shift:     Recent Labs  05/29/13 2130 05/29/13 2139  HGB 16.6 16.0    Recent Labs  05/29/13 2130 05/29/13 2139  WBC 9.6  --   RBC 5.35  --   HCT 46.3 47.0  PLT 195  --     Recent Labs  05/29/13 2130 05/29/13 2139  NA 140 140  K 3.5 3.6  CL 101 102  CO2 23  --   BUN 5* <3*  CREATININE 0.94 1.40*  GLUCOSE 92 88  CALCIUM 8.9  --     Recent Labs  05/29/13 2130  INR 1.03    Neurologically intact Intact pulses distally Dorsiflexion/Plantar flexion intact No pain with passive flex/ext great toe Assessment/Plan: Day of Surgery Procedure(s) (LRB): IRRIGATION AND DEBRIDEMENT left leg; exploration, repair left leg laceration with nerve repair. (Left) Advance diet Up with therapy  Caleb Aguirre C 05/30/2013, 10:24 AM

## 2013-05-30 NOTE — Progress Notes (Signed)
I have seen and examined the patient and agree with the assessment and plans.  Roshard Rezabek A. Tiona Ruane  MD, FACS  
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
## 2013-05-30 NOTE — Progress Notes (Signed)
Patient here post-op IND to left leg. Pulses good, sensation. Pain 10/10. Medicated with Oxy IR 15 mg at 1430. Multiple skin lacerations. Stitches to left neck, staples to left inner thigh. Patient up to chair with PT. NWB on left. Wheelchair and walker for assistance. Assessment done. Plan of care pain management and monitoring comfort/mobility. Sherlyn Lees, RN
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
## 2013-05-30 NOTE — Transfer of Care (Signed)
Immediate Anesthesia Transfer of Care Note  Patient: Caleb Aguirre  Procedure(s) Performed: Procedure(s): IRRIGATION AND DEBRIDEMENT left leg; exploration, repair left leg laceration with nerve repair. (Left)  Patient Location: PACU  Anesthesia Type:General  Level of Consciousness: awake and patient cooperative  Airway & Oxygen Therapy: Patient Spontanous Breathing and Patient connected to face mask oxygen  Post-op Assessment: Report given to PACU RN and Post -op Vital signs reviewed and stable  Post vital signs: Reviewed and stable  Complications: No apparent anesthesia complications
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
## 2013-05-30 NOTE — Preoperative (Signed)
Beta Blockers   Reason not to administer Beta Blockers:Not Applicable 

## 2013-05-30 NOTE — ED Notes (Signed)
Ortho at bedside. Ortho is going to take pt to OR
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
## 2013-05-30 NOTE — Progress Notes (Signed)
Report given  To nurse 5N wife at bedside and aware of move.

## 2013-05-30 NOTE — Anesthesia Preprocedure Evaluation (Addendum)
Anesthesia Evaluation  Patient identified by MRN, date of birth, ID band Patient awake    Reviewed: Allergy & Precautions, H&P , NPO status , Patient's Chart, lab work & pertinent test results  Airway Mallampati: II TM Distance: >3 FB Neck ROM: Full    Dental  (+) Teeth Intact and Dental Advisory Given   Pulmonary Current Smoker,          Cardiovascular     Neuro/Psych    GI/Hepatic GERD-  ,ETOH until 1900 tonight Diet controlled   Endo/Other  obese  Renal/GU      Musculoskeletal   Abdominal   Peds  Hematology   Anesthesia Other Findings   Reproductive/Obstetrics                        Anesthesia Physical Anesthesia Plan  ASA: II and emergent  Anesthesia Plan: General   Post-op Pain Management:    Induction: Intravenous, Rapid sequence and Cricoid pressure planned  Airway Management Planned: Oral ETT  Additional Equipment:   Intra-op Plan:   Post-operative Plan: Extubation in OR  Informed Consent: I have reviewed the patients History and Physical, chart, labs and discussed the procedure including the risks, benefits and alternatives for the proposed anesthesia with the patient or authorized representative who has indicated his/her understanding and acceptance.   Dental advisory given  Plan Discussed with: CRNA and Anesthesiologist  Anesthesia Plan Comments:         Anesthesia Quick Evaluation

## 2013-05-30 NOTE — Anesthesia Procedure Notes (Signed)
Procedure Name: Intubation Date/Time: 05/30/2013 1:13 AM Performed by: Julianne Rice Z Pre-anesthesia Checklist: Patient identified, Timeout performed, Emergency Drugs available, Patient being monitored and Suction available Patient Re-evaluated:Patient Re-evaluated prior to inductionOxygen Delivery Method: Circle system utilized Preoxygenation: Pre-oxygenation with 100% oxygen Intubation Type: IV induction, Rapid sequence and Cricoid Pressure applied Laryngoscope Size: Mac and 4 Grade View: Grade I Tube type: Oral Tube size: 8.0 mm Number of attempts: 1 Airway Equipment and Method: Stylet Placement Confirmation: ETT inserted through vocal cords under direct vision,  breath sounds checked- equal and bilateral and positive ETCO2 Secured at: 23 cm Tube secured with: Tape Dental Injury: Teeth and Oropharynx as per pre-operative assessment
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
## 2013-05-31 NOTE — Anesthesia Postprocedure Evaluation (Signed)
Anesthesia Post Note  Patient: Caleb Aguirre  Procedure(s) Performed: Procedure(s) (LRB): IRRIGATION AND DEBRIDEMENT left leg; exploration, repair left leg laceration with nerve repair. (Left)  Anesthesia type: General  Patient location: PACU  Post pain: Pain level controlled and Adequate analgesia  Post assessment: Post-op Vital signs reviewed, Patient's Cardiovascular Status Stable, Respiratory Function Stable, Patent Airway and Pain level controlled  Last Vitals:  Filed Vitals:   05/30/13 1339  BP: 125/82  Pulse: 99  Temp: 37.6 C  Resp: 16    Post vital signs: Reviewed and stable  Level of consciousness: awake, alert  and oriented  Complications: No apparent anesthesia complications

## 2013-06-01 ENCOUNTER — Encounter (HOSPITAL_COMMUNITY): Payer: Self-pay | Admitting: Emergency Medicine

## 2013-06-01 ENCOUNTER — Encounter (HOSPITAL_COMMUNITY): Payer: Self-pay | Admitting: Orthopedic Surgery

## 2013-12-22 ENCOUNTER — Emergency Department (HOSPITAL_COMMUNITY)
Admission: EM | Admit: 2013-12-22 | Discharge: 2013-12-22 | Disposition: A | Discharge status: Home / Self Care | Payer: No Typology Code available for payment source | Attending: Emergency Medicine | Admitting: Emergency Medicine

## 2013-12-22 ENCOUNTER — Encounter (HOSPITAL_COMMUNITY): Payer: Self-pay | Admitting: Emergency Medicine

## 2013-12-22 DIAGNOSIS — R112 Nausea with vomiting, unspecified: Secondary | ICD-10-CM | POA: Insufficient documentation

## 2013-12-22 DIAGNOSIS — Z85028 Personal history of other malignant neoplasm of stomach: Secondary | ICD-10-CM | POA: Insufficient documentation

## 2013-12-22 DIAGNOSIS — G4730 Sleep apnea, unspecified: Secondary | ICD-10-CM | POA: Insufficient documentation

## 2013-12-22 DIAGNOSIS — F1010 Alcohol abuse, uncomplicated: Secondary | ICD-10-CM | POA: Insufficient documentation

## 2013-12-22 DIAGNOSIS — F17200 Nicotine dependence, unspecified, uncomplicated: Secondary | ICD-10-CM | POA: Insufficient documentation

## 2013-12-22 DIAGNOSIS — G8929 Other chronic pain: Secondary | ICD-10-CM | POA: Insufficient documentation

## 2013-12-22 DIAGNOSIS — F10929 Alcohol use, unspecified with intoxication, unspecified: Secondary | ICD-10-CM

## 2013-12-22 HISTORY — DX: Malignant (primary) neoplasm, unspecified: C80.1

## 2013-12-22 LAB — COMPREHENSIVE METABOLIC PANEL
ALK PHOS: 67 U/L (ref 39–117)
ALT: 33 U/L (ref 0–53)
AST: 21 U/L (ref 0–37)
Albumin: 3.8 g/dL (ref 3.5–5.2)
BILIRUBIN TOTAL: 0.3 mg/dL (ref 0.3–1.2)
BUN: 7 mg/dL (ref 6–23)
CALCIUM: 9.3 mg/dL (ref 8.4–10.5)
CHLORIDE: 96 meq/L (ref 96–112)
CO2: 22 mEq/L (ref 19–32)
CREATININE: 1.03 mg/dL (ref 0.50–1.35)
GFR calc Af Amer: >90 mL/min (ref >90)
GFR, EST NON AFRICAN AMERICAN: 89 mL/min — AB (ref >90)
Glucose, Bld: 92 mg/dL (ref 70–99)
Potassium: 3.6 mEq/L — ABNORMAL LOW (ref 3.7–5.3)
Sodium: 134 mEq/L — ABNORMAL LOW (ref 137–147)
TOTAL PROTEIN: 7.5 g/dL (ref 6.0–8.3)

## 2013-12-22 LAB — CBC
HEMATOCRIT: 46.7 % (ref 39.0–52.0)
Hemoglobin: 16.7 g/dL (ref 13.0–17.0)
MCH: 30 pg (ref 26.0–34.0)
MCHC: 35.8 g/dL (ref 30.0–36.0)
MCV: 84 fL (ref 78.0–100.0)
Platelets: 202 10*3/uL (ref 150–400)
RBC: 5.56 MIL/uL (ref 4.22–5.81)
RDW: 13.2 % (ref 11.5–15.5)
WBC: 12.5 10*3/uL — ABNORMAL HIGH (ref 4.0–10.5)

## 2013-12-22 LAB — LIPASE, BLOOD: Lipase: 25 U/L (ref 11–59)

## 2013-12-22 LAB — ETHANOL: Alcohol, Ethyl (B): 114 mg/dL — ABNORMAL HIGH (ref 0–11)

## 2013-12-22 MED ORDER — ONDANSETRON HCL 4 MG/2ML IJ SOLN
4.0000 mg | Freq: Once | INTRAMUSCULAR | Status: AC
  Administered 2013-12-22: 4 mg via INTRAVENOUS
  Filled 2013-12-22: qty 2

## 2013-12-22 MED ORDER — OMEPRAZOLE 20 MG PO CPDR: 20.0000 mg | DELAYED_RELEASE_CAPSULE | Freq: Every day | ORAL | Status: DC

## 2013-12-22 MED ORDER — SODIUM CHLORIDE 0.9 % IV SOLN
1000.0000 mL | Freq: Once | INTRAVENOUS | Status: AC
  Administered 2013-12-22: 1000 mL via INTRAVENOUS

## 2013-12-22 MED ORDER — PROMETHAZINE HCL 25 MG PO TABS: 25.0000 mg | ORAL_TABLET | Freq: Four times a day (QID) | ORAL | Status: DC | PRN

## 2013-12-22 MED ORDER — SODIUM CHLORIDE 0.9 % IV SOLN
1000.0000 mL | INTRAVENOUS | Status: DC
  Administered 2013-12-22: 1000 mL via INTRAVENOUS
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
## 2013-12-22 NOTE — ED Notes (Signed)
Pt was at [employer] jail d/t alcohol intoxication, states to EMS "he drank a bottle and ate a big meal at [employer]".

## 2013-12-22 NOTE — ED Provider Notes (Signed)
CSN: [id number]     Arrival date & time 12/22/13  0231 History   First MD Initiated Contact with Patient 12/22/13 0246     Chief Complaint  Patient presents with  . Abdominal Pain     (Consider location/radiation/quality/duration/timing/severity/associated sxs/prior Treatment) HPI Patient presents the emergency department 4-5 days of nausea and vomiting.  His vomit has been clear but over the past 2 days he's had occasional streaks of blood in this.  He drinks alcohol occasionally.  He is currently in police custody after being intoxicated.  He denies a history of pancreatitis.  He states he's had "polyps" before and that they were diagnosed during endoscopy in [place].  He states they attempted a colonoscopy but for some reason this was never completed.  He denies melena or hematochezia.  No lightheadedness.  He states he did not want to calm to the emergency department with decreased department made him come.  He told nursing staff that he was diagnosed with "stomach cancer" but it sounds like these are the "polyps" that he is talking about  Past Medical History  Diagnosis Date  . Back pain, chronic   . Sleep apnea     pt supposed to wear cpap, does not have machine  . Cancer     stomach   Past Surgical History  Procedure Laterality Date  . Knee arthroscopy      L knee  . Knee arthroscopy w/ acl reconstruction and patella graft Left   . I&d extremity Left 05/30/2013    Procedure: IRRIGATION AND DEBRIDEMENT left leg; exploration, repair left leg laceration with nerve repair.;  Surgeon: Augustin Schooling, MD;  Location: [facility];  Service: Orthopedics;  Laterality: Left;   No family history on file. History  Substance Use Topics  . Smoking status: Current Every Day Smoker -- 1.00 packs/day for 20 years    Types: Cigarettes  . Smokeless tobacco: Not on file  . Alcohol Use: 4.2 oz/week    7 Cans of beer per week     Comment: occa    Review of Systems  All other systems reviewed and  are negative.     Allergies  Vicodin; Vicodin; and Tomato  Home Medications   Prior to Admission medications   Medication Sig Start Date End Date Taking? Authorizing Provider  promethazine (PHENERGAN) 25 MG tablet Take 1 tablet (25 mg total) by mouth every 6 (six) hours as needed for nausea or vomiting. 12/22/13   Hoy Morn, MD   BP 135/88  Pulse 125  Temp(Src) 99.1 F (37.3 C) (Oral)  Resp 22  SpO2 98% Physical Exam  Nursing note and vitals reviewed. Constitutional: He is oriented to person, place, and time. He appears well-developed and well-nourished.  HENT:  Head: Normocephalic and atraumatic.  Eyes: EOM are normal.  Neck: Normal range of motion.  Cardiovascular: Normal rate, regular rhythm, normal heart sounds and intact distal pulses.   Pulmonary/Chest: Effort normal and breath sounds normal. No respiratory distress.  Abdominal: Soft. He exhibits no distension. There is no tenderness.  Musculoskeletal: Normal range of motion.  Neurological: He is alert and oriented to person, place, and time.  Skin: Skin is warm and dry.  Psychiatric: He has a normal mood and affect. Judgment normal.    ED Course  Procedures (including critical care time) Labs Review Labs Reviewed  CBC - Abnormal; Notable for the following:    WBC 12.5 (*)    All other components within normal limits  COMPREHENSIVE  METABOLIC PANEL - Abnormal; Notable for the following:    Sodium 134 (*)    Potassium 3.6 (*)    GFR calc non Af Amer 89 (*)    All other components within normal limits  ETHANOL - Abnormal; Notable for the following:    Alcohol, Ethyl (B) 114 (*)    All other components within normal limits  LIPASE, BLOOD    Imaging Review No results found.   EKG Interpretation None      MDM   Final diagnoses:  Nausea & vomiting  Alcohol intoxication    Patient is overall well-appearing.  Laboratory studies are without significant abnormality.  He does have alcohol on 14  periods and has custody.  He be discharged home with instructions to followup at the [facility] for a primary care appointment.  He was also given the number for gastroenterology and told that he will likely need followup with them for possible repeat endoscopy.  This may represent gastritis.  A small amount of blood may represent more Mallory-Weiss tear after vomiting for 4-5 days.  He directed a prescription for Phenergan and Prilosec.  He understands to return to the ER for new or worsening symptoms    Hoy Morn, MD 12/22/13 0401

## 2013-12-22 NOTE — ED Notes (Signed)
Patient is actively vomiting, kneeling down on the floor. Patient has been offered IV fluid and medication for the vomiting. He refuses stating, " I don't want no IV, I don't take pills. I'm just here because they brought me here". Dr. Venora Maples has been made aware. [employer] at the bedside with the patient.

## 2013-12-22 NOTE — ED Notes (Signed)
Per EMS pt coming from [employer] jail, pt has been spitting up red stuff x 2 days, has been diagnosed w/ stomach cancer, doing nothing for it. A/o x 3, BP 136/84, HR 112, 96% RA.

## 2013-12-22 NOTE — ED Notes (Signed)
Bed: WA04 Expected date:  Expected time:  Means of arrival:  Comments: EMS 

## 2013-12-22 NOTE — Discharge Instructions (Signed)

## 2014-01-27 ENCOUNTER — Encounter (HOSPITAL_COMMUNITY): Payer: Self-pay | Admitting: Emergency Medicine

## 2014-01-27 ENCOUNTER — Emergency Department (HOSPITAL_COMMUNITY): Payer: No Typology Code available for payment source

## 2014-01-27 ENCOUNTER — Emergency Department (HOSPITAL_COMMUNITY)
Admission: EM | Admit: 2014-01-27 | Discharge: 2014-01-27 | Disposition: A | Discharge status: Home / Self Care | Payer: No Typology Code available for payment source | Attending: Emergency Medicine | Admitting: Emergency Medicine

## 2014-01-27 DIAGNOSIS — J441 Chronic obstructive pulmonary disease with (acute) exacerbation: Secondary | ICD-10-CM | POA: Insufficient documentation

## 2014-01-27 DIAGNOSIS — Z5900 Homelessness unspecified: Secondary | ICD-10-CM | POA: Insufficient documentation

## 2014-01-27 DIAGNOSIS — R000 Tachycardia, unspecified: Secondary | ICD-10-CM | POA: Insufficient documentation

## 2014-01-27 DIAGNOSIS — Z79899 Other long term (current) drug therapy: Secondary | ICD-10-CM | POA: Insufficient documentation

## 2014-01-27 DIAGNOSIS — R1084 Generalized abdominal pain: Secondary | ICD-10-CM | POA: Insufficient documentation

## 2014-01-27 DIAGNOSIS — G8929 Other chronic pain: Secondary | ICD-10-CM | POA: Insufficient documentation

## 2014-01-27 DIAGNOSIS — Z85028 Personal history of other malignant neoplasm of stomach: Secondary | ICD-10-CM | POA: Insufficient documentation

## 2014-01-27 DIAGNOSIS — R079 Chest pain, unspecified: Secondary | ICD-10-CM | POA: Insufficient documentation

## 2014-01-27 DIAGNOSIS — F17200 Nicotine dependence, unspecified, uncomplicated: Secondary | ICD-10-CM | POA: Insufficient documentation

## 2014-01-27 LAB — BASIC METABOLIC PANEL
Anion gap: 17 — ABNORMAL HIGH (ref 5–15)
BUN: 7 mg/dL (ref 6–23)
CHLORIDE: 98 meq/L (ref 96–112)
CO2: 22 mEq/L (ref 19–32)
Calcium: 9 mg/dL (ref 8.4–10.5)
Creatinine, Ser: 1.04 mg/dL (ref 0.50–1.35)
GFR, EST NON AFRICAN AMERICAN: 88 mL/min — AB (ref >90)
Glucose, Bld: 111 mg/dL — ABNORMAL HIGH (ref 70–99)
Potassium: 3.2 mEq/L — ABNORMAL LOW (ref 3.7–5.3)
SODIUM: 137 meq/L (ref 137–147)

## 2014-01-27 LAB — CBC
HCT: 46.2 % (ref 39.0–52.0)
HEMOGLOBIN: 16.1 g/dL (ref 13.0–17.0)
MCH: 29.7 pg (ref 26.0–34.0)
MCHC: 34.8 g/dL (ref 30.0–36.0)
MCV: 85.2 fL (ref 78.0–100.0)
Platelets: 194 10*3/uL (ref 150–400)
RBC: 5.42 MIL/uL (ref 4.22–5.81)
RDW: 14 % (ref 11.5–15.5)
WBC: 14.2 10*3/uL — AB (ref 4.0–10.5)

## 2014-01-27 LAB — I-STAT TROPONIN, ED
Troponin i, poc: 0.03 ng/mL (ref 0.00–0.08)
Troponin i, poc: 0 ng/mL (ref 0.00–0.08)

## 2014-01-27 LAB — ETHANOL: ALCOHOL ETHYL (B): 85 mg/dL — AB (ref 0–11)

## 2014-01-27 MED ORDER — ONDANSETRON HCL 4 MG/2ML IJ SOLN
4.0000 mg | Freq: Once | INTRAMUSCULAR | Status: AC
  Administered 2014-01-27: 4 mg via INTRAVENOUS
  Filled 2014-01-27: qty 2

## 2014-01-27 MED ORDER — ALBUTEROL SULFATE (2.5 MG/3ML) 0.083% IN NEBU
5.0000 mg | INHALATION_SOLUTION | Freq: Once | RESPIRATORY_TRACT | Status: AC
  Administered 2014-01-27: 5 mg via RESPIRATORY_TRACT
  Filled 2014-01-27: qty 6

## 2014-01-27 MED ORDER — GI COCKTAIL ~~LOC~~
30.0000 mL | Freq: Once | ORAL | Status: AC
  Administered 2014-01-27: 30 mL via ORAL
  Filled 2014-01-27: qty 30

## 2014-01-27 MED ORDER — POTASSIUM CHLORIDE CRYS ER 20 MEQ PO TBCR
40.0000 meq | EXTENDED_RELEASE_TABLET | Freq: Once | ORAL | Status: AC
  Administered 2014-01-27: 40 meq via ORAL
  Filled 2014-01-27: qty 2

## 2014-01-27 NOTE — ED Notes (Signed)
Pt ambulated with steady gait to the lobby.

## 2014-01-27 NOTE — ED Notes (Signed)
MD James at bedside.  

## 2014-01-27 NOTE — ED Notes (Signed)
Brought in by EMS with c/o shortness of breath and chest discomfort. Per EMS, pt is homeless and was picked up from a street; pt called EMS d/t "burning sensation in the chest" on breathing, also c/o shortness of breath--- pt has hx of COPD and Bronchitis. Pt was given Albuterol 5 mg per neb tx en route to ED; pt's O2 sat was 87% on room air--- was placed on oxygen at 2 LPM via [place]. Pt presents to ED with productive cough.

## 2014-01-27 NOTE — ED Provider Notes (Signed)
Patient seen and examined. Patient reviewed with Serafina Royals PA. I agree with his assessment. Chest x-ray shows no abnormalities. Patient has clear lungs after albuterol treatments. Heart rate is improved after albuterol and a rate of 99. Initial and repeat troponin normal. No acute changes on EKG. He sleeping soundly. Think is appropriate for outpatient treatment.  Tanna Furry, MD 01/27/14 [phone]

## 2014-01-27 NOTE — ED Notes (Signed)
Pt would not sit still for EKG, attempted several times to capture a clear picture. RN is aware. Will attempt later on as Pt feels better to capture a clear picture.

## 2014-01-27 NOTE — ED Notes (Signed)
Bed: WA09 Expected date:  Expected time:  Means of arrival:  Comments: EMS 43yo M, burning sensation to chest when takes deep breath 87% on RA

## 2014-01-27 NOTE — Discharge Instructions (Signed)

## 2014-01-27 NOTE — ED Provider Notes (Signed)
CSN: [id number]     Arrival date & time 01/27/14  0257 History   First MD Initiated Contact with Patient 01/27/14 [phone]     Chief Complaint  Patient presents with  . Shortness of Breath  . Chest Pain   HPI  History provided by the patient. The patient is a 43 year old male with history of COPD, POTS, alcohol abuse, chronic back pain, sleep apnea who presents with complaints of acute shortness of breath. Patient is homeless who was walking on the street and reports only feeling very short of breath. Patient does admit to drinking at the time and has had 3-4, 40 ounce beers. Patient is also a smoker. He states he did not feel like any typical COPD symptoms. Did have some associated burning chest pain with deep breath.    Past Medical History  Diagnosis Date  . Back pain, chronic   . Sleep apnea     pt supposed to wear cpap, does not have machine  . Cancer     stomach   Past Surgical History  Procedure Laterality Date  . Knee arthroscopy      L knee  . Knee arthroscopy w/ acl reconstruction and patella graft Left   . I&d extremity Left 05/30/2013    Procedure: IRRIGATION AND DEBRIDEMENT left leg; exploration, repair left leg laceration with nerve repair.;  Surgeon: Augustin Schooling, MD;  Location: [facility];  Service: Orthopedics;  Laterality: Left;   History reviewed. No pertinent family history. History  Substance Use Topics  . Smoking status: Current Every Day Smoker -- 1.00 packs/day for 20 years    Types: Cigarettes  . Smokeless tobacco: Not on file  . Alcohol Use: 4.2 oz/week    7 Cans of beer per week     Comment: occa    Review of Systems  Constitutional: Negative for fever, chills and diaphoresis.  Respiratory: Positive for shortness of breath. Negative for cough and wheezing.   Cardiovascular: Positive for chest pain. Negative for palpitations and leg swelling.  Gastrointestinal: Negative for nausea, vomiting and abdominal pain.  All other systems reviewed and are  negative.     Allergies  Vicodin; Vicodin; and Tomato  Home Medications   Prior to Admission medications   Medication Sig Start Date End Date Taking? Authorizing Provider  omeprazole (PRILOSEC) 20 MG capsule Take 1 capsule (20 mg total) by mouth daily. 12/22/13   Hoy Morn, MD  promethazine (PHENERGAN) 25 MG tablet Take 1 tablet (25 mg total) by mouth every 6 (six) hours as needed for nausea or vomiting. 12/22/13   Hoy Morn, MD   BP 146/70  Pulse 111  Temp(Src) 99 F (37.2 C) (Oral)  Resp 25  SpO2 91% Physical Exam  Nursing note and vitals reviewed. Constitutional: He is oriented to person, place, and time. He appears well-developed and well-nourished.  HENT:  Head: Normocephalic.  Mouth/Throat: Oropharynx is clear and moist.  Cardiovascular: Regular rhythm.  Tachycardia present.   Pulmonary/Chest: Effort normal and breath sounds normal. No respiratory distress. He has no wheezes. He has no rales.  Abdominal: Soft. There is tenderness. There is no rebound and no guarding.  Diffuse tenderness  Musculoskeletal: Normal range of motion.  Neurological: He is alert and oriented to person, place, and time.  Skin: Skin is warm.  Psychiatric: He has a normal mood and affect. His behavior is normal.    ED Course  Procedures   COORDINATION OF CARE:  Nursing notes reviewed. Vital signs reviewed.  Initial pt interview and examination performed.   Filed Vitals:   01/27/14 0302  BP: 146/70  Pulse: 111  Temp: 99 F (37.2 C)  TempSrc: Oral  Resp: 25  SpO2: 91%    4:08 AM-Pt seen and evaluated.  Pt with slight tachypnea. Denies chest pain now. SOB improved after breathing treatments.  cxr pending.  Pt has been sleeping. O2 sats at 95% on RA. Continues to be tachycardic.  Pt discussed with Attending Physician.  He will reassess pt and evaluate for further treatments.    Treatment plan initiated: Medications  potassium chloride SA (K-DUR,KLOR-CON) CR tablet 40 mEq  (not administered)  ondansetron (ZOFRAN) injection 4 mg (4 mg Intravenous Given 01/27/14 0332)  albuterol (PROVENTIL) (2.5 MG/3ML) 0.083% nebulizer solution 5 mg (5 mg Nebulization Given 01/27/14 0329)    Results for orders placed during the hospital encounter of 01/27/14  CBC      Result Value Ref Range   WBC 14.2 (*) 4.0 - 10.5 K/uL   RBC 5.42  4.22 - 5.81 MIL/uL   Hemoglobin 16.1  13.0 - 17.0 g/dL   HCT 46.2  39.0 - 52.0 %   MCV 85.2  78.0 - 100.0 fL   MCH 29.7  26.0 - 34.0 pg   MCHC 34.8  30.0 - 36.0 g/dL   RDW 14.0  11.5 - 15.5 %   Platelets 194  150 - 400 K/uL  BASIC METABOLIC PANEL      Result Value Ref Range   Sodium 137  137 - 147 mEq/L   Potassium 3.2 (*) 3.7 - 5.3 mEq/L   Chloride 98  96 - 112 mEq/L   CO2 22  19 - 32 mEq/L   Glucose, Bld 111 (*) 70 - 99 mg/dL   BUN 7  6 - 23 mg/dL   Creatinine, Ser 1.04  0.50 - 1.35 mg/dL   Calcium 9.0  8.4 - 10.5 mg/dL   GFR calc non Af Amer 88 (*) >90 mL/min   GFR calc Af Amer >90  >90 mL/min   Anion gap 17 (*) 5 - 15  ETHANOL      Result Value Ref Range   Alcohol, Ethyl (B) 85 (*) 0 - 11 mg/dL  I-STAT TROPOININ, ED      Result Value Ref Range   Troponin i, poc 0.03  0.00 - 0.08 ng/mL   Comment 3               Imaging Review Dg Chest Port 1 View  01/27/2014   CLINICAL DATA:  Chest pain, shortness of breath, vomiting.  EXAM: PORTABLE CHEST - 1 VIEW  COMPARISON:  None.  FINDINGS: Shallow inspiration. The heart size and mediastinal contours are within normal limits. Both lungs are clear. The visualized skeletal structures are unremarkable.  IMPRESSION: No active disease.   Electronically Signed   By: Lucienne Capers M.D.   On: 01/27/2014 03:47     EKG Interpretation None      Date: 01/27/2014  Rate: 115  Rhythm: sinus tachycardia  QRS Axis: normal  Intervals: artifact  ST/T Wave abnormalities: normal  Conduction Disutrbances:none  Narrative Interpretation: large amount of artifact  Old EKG Reviewed:     MDM    Final diagnoses:  None       Martie Lee, PA-C 01/27/14 [phone]

## 2014-01-30 NOTE — ED Provider Notes (Signed)
Medical screening examination/treatment/procedure(s) were conducted as a shared visit with non-physician practitioner(s) and myself.  I personally evaluated the patient during the encounter.   EKG Interpretation   Date/Time:  Wednesday January 27 2014 03:23:05 EDT Ventricular Rate:  115 PR Interval:  146 QRS Duration: 102 QT Interval:  345 QTC Calculation: 477 R Axis:   69 Text Interpretation:  Sinus tachycardia Nonspecific repol abnormality,  lateral leads Artifact in lead(s) I II III aVR aVL aVF V2 V3 V4 V5 V6  Confirmed by Jeneen Rinks  MD, [place] ([id number]) on 01/27/2014 6:24:11 AM      Pt seen and evaluated.  D/W Hazel Sams, PA, and I agree with his assessment. Pt sleeping. Not tachy at rest. Not hypoxemic. Ambulatory without symptoms.  Delta troponin normal.  Pt stable for DC.  Tanna Furry, MD 01/30/14 [phone]
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
## 2014-11-09 ENCOUNTER — Emergency Department (HOSPITAL_COMMUNITY): Payer: No Typology Code available for payment source

## 2014-11-09 ENCOUNTER — Emergency Department (HOSPITAL_COMMUNITY)
Admission: EM | Admit: 2014-11-09 | Discharge: 2014-11-09 | Disposition: A | Discharge status: Home / Self Care | Payer: No Typology Code available for payment source | Attending: Emergency Medicine | Admitting: Emergency Medicine

## 2014-11-09 ENCOUNTER — Encounter (HOSPITAL_COMMUNITY): Payer: Self-pay | Admitting: *Deleted

## 2014-11-09 DIAGNOSIS — G8929 Other chronic pain: Secondary | ICD-10-CM | POA: Insufficient documentation

## 2014-11-09 DIAGNOSIS — Y9289 Other specified places as the place of occurrence of the external cause: Secondary | ICD-10-CM | POA: Insufficient documentation

## 2014-11-09 DIAGNOSIS — S61230A Puncture wound without foreign body of right index finger without damage to nail, initial encounter: Secondary | ICD-10-CM | POA: Insufficient documentation

## 2014-11-09 DIAGNOSIS — Y998 Other external cause status: Secondary | ICD-10-CM | POA: Insufficient documentation

## 2014-11-09 DIAGNOSIS — Z720 Tobacco use: Secondary | ICD-10-CM | POA: Insufficient documentation

## 2014-11-09 DIAGNOSIS — Z8669 Personal history of other diseases of the nervous system and sense organs: Secondary | ICD-10-CM | POA: Insufficient documentation

## 2014-11-09 DIAGNOSIS — W458XXA Other foreign body or object entering through skin, initial encounter: Secondary | ICD-10-CM | POA: Insufficient documentation

## 2014-11-09 DIAGNOSIS — Z23 Encounter for immunization: Secondary | ICD-10-CM | POA: Insufficient documentation

## 2014-11-09 DIAGNOSIS — Z85028 Personal history of other malignant neoplasm of stomach: Secondary | ICD-10-CM | POA: Insufficient documentation

## 2014-11-09 DIAGNOSIS — Y9389 Activity, other specified: Secondary | ICD-10-CM | POA: Insufficient documentation

## 2014-11-09 DIAGNOSIS — Z8739 Personal history of other diseases of the musculoskeletal system and connective tissue: Secondary | ICD-10-CM | POA: Insufficient documentation

## 2014-11-09 DIAGNOSIS — S61239A Puncture wound without foreign body of unspecified finger without damage to nail, initial encounter: Secondary | ICD-10-CM

## 2014-11-09 MED ORDER — AMOXICILLIN-POT CLAVULANATE 875-125 MG PO TABS
1.0000 | ORAL_TABLET | Freq: Once | ORAL | Status: AC
  Administered 2014-11-09: 1 via ORAL
  Filled 2014-11-09: qty 1

## 2014-11-09 MED ORDER — ONDANSETRON 8 MG PO TBDP
8.0000 mg | ORAL_TABLET | Freq: Once | ORAL | Status: AC
  Administered 2014-11-09: 8 mg via ORAL
  Filled 2014-11-09: qty 1

## 2014-11-09 MED ORDER — IBUPROFEN 200 MG PO TABS
600.0000 mg | ORAL_TABLET | Freq: Once | ORAL | Status: AC
  Administered 2014-11-09: 600 mg via ORAL
  Filled 2014-11-09: qty 3

## 2014-11-09 MED ORDER — TETANUS-DIPHTH-ACELL PERTUSSIS 5-2.5-18.5 LF-MCG/0.5 IM SUSP
0.5000 mL | Freq: Once | INTRAMUSCULAR | Status: AC
  Administered 2014-11-09: 0.5 mL via INTRAMUSCULAR
  Filled 2014-11-09: qty 0.5

## 2014-11-09 MED ORDER — TRAMADOL HCL 50 MG PO TABS
50.0000 mg | ORAL_TABLET | Freq: Once | ORAL | Status: AC
  Administered 2014-11-09: 50 mg via ORAL
  Filled 2014-11-09: qty 1

## 2014-11-09 MED ORDER — IBUPROFEN 600 MG PO TABS: 600.0000 mg | ORAL_TABLET | Freq: Three times a day (TID) | ORAL | Status: DC

## 2014-11-09 MED ORDER — AMOXICILLIN-POT CLAVULANATE 875-125 MG PO TABS
1.0000 | ORAL_TABLET | Freq: Two times a day (BID) | ORAL | Status: DC
Start: 2014-11-09 — End: 2017-05-17

## 2014-11-09 MED ORDER — TRAMADOL HCL 50 MG PO TABS: 50.0000 mg | ORAL_TABLET | Freq: Once | ORAL | Status: DC

## 2014-11-09 NOTE — ED Notes (Signed)
Pt reports he was sliding a board with a rusty nail on it and it went through his R index finger.  Pt reports he pulled the nail out, seemed that about an inch of it was embedded in his finger.  Pt reports pouring isopropyl alcohol on his finger.  Pt reports this happened about 5 hours prior.  Pt reports severe pain is radiating up to his R FA.

## 2014-11-09 NOTE — ED Notes (Signed)
AVS explained in detail, knows to eat before taking anymore medication d/t nausea after Tramadol and Ibuprofen. Given a sandwich-visualized patient eating without adverse effects (nausea, etc). Acknowledged understanding. No other c/c.

## 2014-11-09 NOTE — Discharge Instructions (Signed)
Her x-ray does not show any fracture of the bones does not show any metallic fragments U been placed on an antibiotic as a precaution.  Your tetanus has been updated U been given medications for pain control.  If you do not see significant improvement in your finger, or you have worsening symptoms please make an appointment with Dr. Lenon Curt for further evaluation

## 2014-11-09 NOTE — ED Provider Notes (Signed)
CSN: [id number]     Arrival date & time 11/09/14  2013 History  This chart was scribed for non-physician practitioner, Junius Creamer, NP working with Noemi Chapel, MD by Evelene Croon, ED Scribe. This patient was seen in room [facility] and the patient's care was started at 8:35 PM.    Chief Complaint  Patient presents with  . Finger Injury    The history is provided by the patient. No language interpreter was used.     HPI Comments:  Caleb Aguirre is a 44 y.o. male who presents to the Emergency Department complaining of moderate constant pain to his right index finger following injury from a rusty nail about 4-5 hours PTA . Pt states he was sliding 2x2 when the nail punctured his finger. He pulled the nail out himself and cleaned the wound with isopropyl alcohol PTA. No alleviating factors or associated symptoms noted. Tetanus is NOT UTD.  Past Medical History  Diagnosis Date  . Back pain, chronic   . Sleep apnea     pt supposed to wear cpap, does not have machine  . Cancer     stomach   Past Surgical History  Procedure Laterality Date  . Knee arthroscopy      L knee  . Knee arthroscopy w/ acl reconstruction and patella graft Left   . I&d extremity Left 05/30/2013    Procedure: IRRIGATION AND DEBRIDEMENT left leg; exploration, repair left leg laceration with nerve repair.;  Surgeon: Augustin Schooling, MD;  Location: [facility];  Service: Orthopedics;  Laterality: Left;   No family history on file. History  Substance Use Topics  . Smoking status: Current Every Day Smoker -- 1.00 packs/day for 20 years    Types: Cigarettes  . Smokeless tobacco: Not on file  . Alcohol Use: 4.2 oz/week    7 Cans of beer per week     Comment: occa    Review of Systems  Musculoskeletal: Positive for myalgias (Finger Pain ).  Skin: Positive for wound.      Allergies  Vicodin and Tomato  Home Medications   Prior to Admission medications   Medication Sig Start Date End Date Taking? Authorizing  Provider  amoxicillin-clavulanate (AUGMENTIN) 875-125 MG per tablet Take 1 tablet by mouth every 12 (twelve) hours. 11/09/14   Junius Creamer, NP  ibuprofen (ADVIL,MOTRIN) 600 MG tablet Take 1 tablet (600 mg total) by mouth 3 (three) times daily. 11/09/14   Junius Creamer, NP  traMADol (ULTRAM) 50 MG tablet Take 1 tablet (50 mg total) by mouth once. 11/09/14   Junius Creamer, NP   BP 157/99 mmHg  Pulse 124  Temp(Src) 99 F (37.2 C) (Oral)  Resp 18  SpO2 97% Physical Exam  Constitutional: He is oriented to person, place, and time. He appears well-developed and well-nourished.  HENT:  Head: Normocephalic and atraumatic.  Eyes: Conjunctivae are normal.  Cardiovascular: Normal rate.   Pulmonary/Chest: Effort normal.  Abdominal: He exhibits no distension.  Musculoskeletal: He exhibits tenderness. He exhibits no edema.       Hands: Neurological: He is alert and oriented to person, place, and time.  Skin: Skin is warm and dry.  Psychiatric: He has a normal mood and affect.  Nursing note and vitals reviewed.   ED Course  Procedures   DIAGNOSTIC STUDIES:  Oxygen Saturation is 97% on RA, normal by my interpretation.    COORDINATION OF CARE:  8:38 PM Will soak the finger in betadine and saline and order XR of the  right index finger. Discussed treatment plan with pt at bedside and pt agreed to plan.   Labs Review Labs Reviewed - No data to display  Imaging Review Dg Finger Index Right  11/09/2014   CLINICAL DATA:  Right index finger injury. Board with rusty nail went into the finger. Initial encounter.  EXAM: RIGHT INDEX FINGER 2+V  COMPARISON:  None.  FINDINGS: There is soft tissue swelling involving the index finger. No acute fracture or dislocation is identified. No radiopaque foreign body or destructive osseous lesion is seen.  IMPRESSION: No acute osseous abnormality or radiopaque foreign body identified.   Electronically Signed   By: Logan Bores   On: 11/09/2014 21:23     EKG  Interpretation None     Patient has been placed on antibiotic.  Tetanus has been updated.  He's been given referral to hand surgery.  She does not see significant improvement or as if worsening symptoms MDM   Final diagnoses:  Puncture wound of finger of right hand, initial encounter   I personally performed the services described in this documentation, which was scribed in my presence. The recorded information has been reviewed and is accurate.  Junius Creamer, NP 11/09/14 [id number]  Junius Creamer, NP 11/09/14 [id number]  Noemi Chapel, MD 11/10/14 [phone]

## 2017-05-17 ENCOUNTER — Encounter (HOSPITAL_COMMUNITY): Payer: Self-pay | Admitting: Emergency Medicine

## 2017-05-17 ENCOUNTER — Emergency Department (HOSPITAL_COMMUNITY)
Admission: EM | Admit: 2017-05-17 | Discharge: 2017-05-17 | Disposition: A | Discharge status: Home / Self Care | Payer: Medicaid Other | Attending: Emergency Medicine | Admitting: Emergency Medicine

## 2017-05-17 DIAGNOSIS — Z79899 Other long term (current) drug therapy: Secondary | ICD-10-CM | POA: Diagnosis not present

## 2017-05-17 DIAGNOSIS — L03115 Cellulitis of right lower limb: Secondary | ICD-10-CM | POA: Diagnosis not present

## 2017-05-17 DIAGNOSIS — Z23 Encounter for immunization: Secondary | ICD-10-CM | POA: Insufficient documentation

## 2017-05-17 DIAGNOSIS — L02211 Cutaneous abscess of abdominal wall: Secondary | ICD-10-CM | POA: Insufficient documentation

## 2017-05-17 DIAGNOSIS — L02415 Cutaneous abscess of right lower limb: Secondary | ICD-10-CM | POA: Insufficient documentation

## 2017-05-17 DIAGNOSIS — F17210 Nicotine dependence, cigarettes, uncomplicated: Secondary | ICD-10-CM | POA: Insufficient documentation

## 2017-05-17 DIAGNOSIS — L0291 Cutaneous abscess, unspecified: Secondary | ICD-10-CM

## 2017-05-17 MED ORDER — CEPHALEXIN 500 MG PO CAPS
500.0000 mg | ORAL_CAPSULE | Freq: Once | ORAL | Status: AC
  Administered 2017-05-17: 500 mg via ORAL
  Filled 2017-05-17: qty 1

## 2017-05-17 MED ORDER — OXYCODONE-ACETAMINOPHEN 5-325 MG PO TABS
2.0000 | ORAL_TABLET | Freq: Once | ORAL | Status: AC
  Administered 2017-05-17: 2 via ORAL
  Filled 2017-05-17: qty 2

## 2017-05-17 MED ORDER — IBUPROFEN 800 MG PO TABS: 800.0000 mg | ORAL_TABLET | Freq: Three times a day (TID) | ORAL | 0 refills | Status: DC | PRN

## 2017-05-17 MED ORDER — KETOROLAC TROMETHAMINE 30 MG/ML IJ SOLN
60.0000 mg | Freq: Once | INTRAMUSCULAR | Status: AC
  Administered 2017-05-17: 60 mg via INTRAMUSCULAR
  Filled 2017-05-17 (×2): qty 2

## 2017-05-17 MED ORDER — TETANUS-DIPHTH-ACELL PERTUSSIS 5-2.5-18.5 LF-MCG/0.5 IM SUSP
0.5000 mL | Freq: Once | INTRAMUSCULAR | Status: AC
  Administered 2017-05-17: 0.5 mL via INTRAMUSCULAR
  Filled 2017-05-17: qty 0.5

## 2017-05-17 MED ORDER — OXYCODONE-ACETAMINOPHEN 5-325 MG PO TABS: 1.0000 | ORAL_TABLET | ORAL | 0 refills | Status: DC | PRN

## 2017-05-17 MED ORDER — CEPHALEXIN 500 MG PO CAPS: 500.0000 mg | ORAL_CAPSULE | Freq: Four times a day (QID) | ORAL | 0 refills | Status: DC

## 2017-05-17 MED ORDER — SULFAMETHOXAZOLE-TRIMETHOPRIM 800-160 MG PO TABS
1.0000 | ORAL_TABLET | Freq: Once | ORAL | Status: AC
  Administered 2017-05-17: 1 via ORAL
  Filled 2017-05-17: qty 1

## 2017-05-17 MED ORDER — LIDOCAINE-EPINEPHRINE (PF) 2 %-1:200000 IJ SOLN
20.0000 mL | Freq: Once | INTRAMUSCULAR | Status: AC
  Administered 2017-05-17: 20 mL via INTRADERMAL
  Filled 2017-05-17: qty 20

## 2017-05-17 MED ORDER — SULFAMETHOXAZOLE-TRIMETHOPRIM 800-160 MG PO TABS: 1.0000 | ORAL_TABLET | Freq: Two times a day (BID) | ORAL | 0 refills | Status: AC

## 2017-05-17 NOTE — ED Triage Notes (Signed)
Pt reports that 3 weeks ago he fell and had clips from ceiling tiles puncture area on right lower back and now is having drainage from wound and has been hurting all over along with possible abscess to posterior right knee.

## 2017-05-17 NOTE — ED Provider Notes (Signed)
TIME SEEN: 2:08 AM  CHIEF COMPLAINT: Abscess to the right flank, possible abscess to the right knee  HPI: Patient is a 46 year old male with history of chronic back pain who presents to the emergency department with an abscess that is draining purulent drainage to the right flank that has been there for 3 weeks.  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  He was seen at [facility] and had imaging that he reports was negative.  States he had a small laceration to this area and it has now become no fevers, nausea, vomiting or diarrhea.  He is not a diabetic.  He is unsure of his last tetanus vaccination.  Patient also states that over the past several days he has noticed a red hard area to the inside of the right knee.  No difficulty ambulating.  No joint swelling.  No injury to this leg.  ROS: See HPI Constitutional: no fever  Eyes: no drainage  ENT: no runny nose   Cardiovascular:  no chest pain  Resp: no SOB  GI: no vomiting GU: no dysuria Integumentary: no rash  Allergy: no hives  Musculoskeletal: no leg swelling  Neurological: no slurred speech ROS otherwise negative  PAST MEDICAL HISTORY/PAST SURGICAL HISTORY:  Past Medical History:  Diagnosis Date  . Back pain, chronic   . Cancer ([place])    stomach  . Sleep apnea    pt supposed to wear cpap, does not have machine    MEDICATIONS:  Prior to Admission medications   Medication Sig Start Date End Date Taking? Authorizing Provider  amoxicillin-clavulanate (AUGMENTIN) 875-125 MG per tablet Take 1 tablet by mouth every 12 (twelve) hours. 11/09/14   Junius Creamer, NP  ibuprofen (ADVIL,MOTRIN) 600 MG tablet Take 1 tablet (600 mg total) by mouth 3 (three) times daily. 11/09/14   Junius Creamer, NP  traMADol (ULTRAM) 50 MG tablet Take 1 tablet (50 mg total) by mouth once. 11/09/14   Junius Creamer, NP    ALLERGIES:  Allergies  Allergen  Reactions  . Vicodin [Hydrocodone-Acetaminophen] Itching  . Tomato Rash    SOCIAL HISTORY:  Social History  Substance Use Topics  . Smoking status: Current Every Day Smoker    Packs/day: 1.00    Years: 20.00    Types: Cigarettes  . Smokeless tobacco: Not on file  . Alcohol use 4.2 oz/week    7 Cans of beer per week     Comment: occa    FAMILY HISTORY: History reviewed. No pertinent family history.  EXAM: BP (!) 134/97 (BP Location: Left Arm)   Pulse (!) 107   Temp 98.1 F (36.7 C) (Oral)   Resp 16   Ht 5\' 10"  (1.778 m)   Wt 86.2 kg (190 lb)   SpO2 98%   BMI 27.26 kg/m  CONSTITUTIONAL: Alert and oriented and responds appropriately to questions. Well-appearing; well-nourished HEAD: Normocephalic EYES: Conjunctivae clear, pupils appear equal, EOMI ENT: normal nose; moist mucous membranes NECK: Supple, no meningismus, no nuchal rigidity, no LAD  CARD: RRR; S1 and S2 appreciated; no murmurs, no clicks, no rubs, no gallops RESP: Normal chest excursion without splinting or tachypnea; breath sounds clear and equal bilaterally; no wheezes, no rhonchi, no rales, no hypoxia or respiratory distress, speaking full sentences ABD/GI: Normal bowel sounds; non-distended; soft, non-tender, no rebound, no guarding, no peritoneal signs, no hepatosplenomegaly BACK:  The back appears normal  and is non-tender to palpation, there is no CVA tenderness; no midline step-off or deformity, no midline spinal tenderness, patient has a 5 x 6 cm indurated erythematous area to the left side of the lower back with a large amount of purulent drainage EXT: Normal ROM in all joints; non-tender to palpation; no edema; normal capillary refill; no cyanosis, no calf tenderness or swelling, no joint effusion, compartments are soft, 2+ DP pulses bilaterally, patient has a hard, red, indurated area to the medial aspect of the distal right thigh just above the knee joint    SKIN: Normal color for age and race; warm; no  rash NEURO: Moves all extremities equally PSYCH: The patient's mood and manner are appropriate. Grooming and personal hygiene are appropriate.  MEDICAL DECISION MAKING: Patient here with an abscess to the right flank and a possible abscess and area of cellulitis to the right knee septic arthritis.  We will open the area on his right flank up more to allow it to drain.  I have sent a wound culture.  We will place him on Bactrim and Keflex.  Given Percocet for pain control.  We will also ultrasound and further inspect the area of the right knee to see if there is any abscess that needs drainage today.  ED PROGRESS: Incision and drainage of right distal medial thigh and right flank performed by Charlann Lange, PA.  Patient will be discharged with Keflex, Bactrim and short course of Percocet.  Discussed wound care instructions and supportive care instructions.  Recommended close outpatient follow-up.   At this time, I do not feel there is any life-threatening condition present. I have reviewed and discussed all results (EKG, imaging, lab, urine as appropriate) and exam findings with patient/family. I have reviewed nursing notes and appropriate previous records.  I feel the patient is safe to be discharged home without further emergent workup and can continue workup as an outpatient as needed. Discussed usual and customary return precautions. Patient/family verbalize understanding and are comfortable with this plan.  Outpatient follow-up has been provided if needed. All questions have been answered.        Zendaya Groseclose, Delice Bison, DO 05/17/17 [phone]

## 2017-05-17 NOTE — ED Provider Notes (Signed)
INCISION AND DRAINAGE Performed by: Charlann Lange A Consent: Verbal consent obtained. Risks and benefits: risks, benefits and alternatives were discussed Type: abscess  Body area: right iliac crest  Anesthesia: local infiltration  Incision was made with a scalpel.  Local anesthetic: lidocaine 2% w/epinephrine  Anesthetic total: 3 ml  Complexity: complex Blunt dissection to break up loculations  Drainage: purulent  Drainage amount: bloody, puruent  Packing material: 1/2 in iodoform gauze  Patient tolerance: Patient tolerated the procedure well with no immediate complications.   INCISION AND DRAINAGE Performed by: Charlann Lange A Consent: Verbal consent obtained. Risks and benefits: risks, benefits and alternatives were discussed Type: abscess  Body area: right knee, medially  Anesthesia: local infiltration  Incision was made with a scalpel.  Local anesthetic: lidocaine 2% w/epinephrine  Anesthetic total: 2 ml  Complexity: complex Blunt dissection to break up loculations  Drainage: purulent  Drainage amount: copious, purulent, bloody  Packing material: 1/2 in iodoform gauze  Patient tolerance: Patient tolerated the procedure well with no immediate complications.      Charlann Lange, PA-C 05/17/17 [phone]

## 2017-05-19 LAB — AEROBIC CULTURE  (SUPERFICIAL SPECIMEN)

## 2017-05-19 LAB — AEROBIC CULTURE W GRAM STAIN (SUPERFICIAL SPECIMEN)

## 2017-05-20 ENCOUNTER — Telehealth: Payer: Self-pay | Admitting: Emergency Medicine

## 2017-05-20 NOTE — Telephone Encounter (Signed)
Post ED Visit - Positive Culture Follow-up  Culture report reviewed by antimicrobial stewardship pharmacist:  []  Elenor Quinones, Pharm.D. [x]  Heide Guile, Pharm.D., BCPS AQ-ID []  Parks Neptune, Pharm.D., BCPS []  Alycia Rossetti, Pharm.D., BCPS []  Killian, Pharm.D., BCPS, AAHIVP []  Legrand Como, Pharm.D., BCPS, AAHIVP []  Salome Arnt, PharmD, BCPS []  Dimitri Ped, PharmD, BCPS []  Vincenza Hews, PharmD, BCPS  Positive wound culture Treated with cephalexin, sulfamethoxazole-trimethoprim,, organism sensitive to the same and no further patient follow-up is required at this time.  Hazle Nordmann 05/20/2017, 11:23 AM

## 2021-02-27 ENCOUNTER — Emergency Department (HOSPITAL_COMMUNITY)
Admission: EM | Admit: 2021-02-27 | Discharge: 2021-02-27 | Disposition: A | Discharge status: Home / Self Care | Payer: Medicaid Other | Attending: Emergency Medicine | Admitting: Emergency Medicine

## 2021-02-27 ENCOUNTER — Other Ambulatory Visit: Payer: Self-pay

## 2021-02-27 ENCOUNTER — Emergency Department (HOSPITAL_COMMUNITY): Payer: Medicaid Other

## 2021-02-27 ENCOUNTER — Encounter (HOSPITAL_COMMUNITY): Payer: Self-pay | Admitting: *Deleted

## 2021-02-27 DIAGNOSIS — Z85828 Personal history of other malignant neoplasm of skin: Secondary | ICD-10-CM | POA: Insufficient documentation

## 2021-02-27 DIAGNOSIS — L02414 Cutaneous abscess of left upper limb: Secondary | ICD-10-CM | POA: Insufficient documentation

## 2021-02-27 DIAGNOSIS — R000 Tachycardia, unspecified: Secondary | ICD-10-CM | POA: Insufficient documentation

## 2021-02-27 DIAGNOSIS — F17210 Nicotine dependence, cigarettes, uncomplicated: Secondary | ICD-10-CM | POA: Insufficient documentation

## 2021-02-27 DIAGNOSIS — L0291 Cutaneous abscess, unspecified: Secondary | ICD-10-CM

## 2021-02-27 LAB — CBC WITH DIFFERENTIAL/PLATELET
Abs Immature Granulocytes: 0.08 10*3/uL — ABNORMAL HIGH (ref 0.00–0.07)
Basophils Absolute: 0.1 10*3/uL (ref 0.0–0.1)
Basophils Relative: 1 %
Eosinophils Absolute: 0.3 10*3/uL (ref 0.0–0.5)
Eosinophils Relative: 2 %
HCT: 44.5 % (ref 39.0–52.0)
Hemoglobin: 14.5 g/dL (ref 13.0–17.0)
Immature Granulocytes: 1 %
Lymphocytes Relative: 24 %
Lymphs Abs: 3.1 10*3/uL (ref 0.7–4.0)
MCH: 26.8 pg (ref 26.0–34.0)
MCHC: 32.6 g/dL (ref 30.0–36.0)
MCV: 82.1 fL (ref 80.0–100.0)
Monocytes Absolute: 0.6 10*3/uL (ref 0.1–1.0)
Monocytes Relative: 4 %
Neutro Abs: 9 10*3/uL — ABNORMAL HIGH (ref 1.7–7.7)
Neutrophils Relative %: 68 %
Platelets: 306 10*3/uL (ref 150–400)
RBC: 5.42 MIL/uL (ref 4.22–5.81)
RDW: 14.2 % (ref 11.5–15.5)
WBC: 13.1 10*3/uL — ABNORMAL HIGH (ref 4.0–10.5)
nRBC: 0 % (ref 0.0–0.2)

## 2021-02-27 LAB — COMPREHENSIVE METABOLIC PANEL
ALT: 37 U/L (ref 0–44)
AST: 20 U/L (ref 15–41)
Albumin: 4.1 g/dL (ref 3.5–5.0)
Alkaline Phosphatase: 80 U/L (ref 38–126)
Anion gap: 9 (ref 5–15)
BUN: 19 mg/dL (ref 6–20)
CO2: 24 mmol/L (ref 22–32)
Calcium: 8.8 mg/dL — ABNORMAL LOW (ref 8.9–10.3)
Chloride: 107 mmol/L (ref 98–111)
Creatinine, Ser: 0.81 mg/dL (ref 0.61–1.24)
GFR, Estimated: >60 mL/min (ref >60)
Glucose, Bld: 108 mg/dL — ABNORMAL HIGH (ref 70–99)
Potassium: 3.6 mmol/L (ref 3.5–5.1)
Sodium: 140 mmol/L (ref 135–145)
Total Bilirubin: 0.4 mg/dL (ref 0.3–1.2)
Total Protein: 8 g/dL (ref 6.5–8.1)

## 2021-02-27 LAB — LACTIC ACID, PLASMA: Lactic Acid, Venous: 0.8 mmol/L (ref 0.5–1.9)

## 2021-02-27 MED ORDER — KETOROLAC TROMETHAMINE 30 MG/ML IJ SOLN
30.0000 mg | Freq: Once | INTRAMUSCULAR | Status: AC
  Administered 2021-02-27: 30 mg via INTRAVENOUS
  Filled 2021-02-27: qty 1

## 2021-02-27 MED ORDER — OXYCODONE-ACETAMINOPHEN 5-325 MG PO TABS
1.0000 | ORAL_TABLET | Freq: Once | ORAL | Status: AC
  Administered 2021-02-27: 1 via ORAL
  Filled 2021-02-27: qty 1

## 2021-02-27 MED ORDER — FENTANYL CITRATE (PF) 100 MCG/2ML IJ SOLN
50.0000 ug | Freq: Once | INTRAMUSCULAR | Status: DC
  Filled 2021-02-27: qty 2

## 2021-02-27 MED ORDER — OXYCODONE HCL 5 MG PO TABS: 5.0000 mg | ORAL_TABLET | Freq: Four times a day (QID) | ORAL | 0 refills | Status: DC | PRN

## 2021-02-27 MED ORDER — LACTATED RINGERS IV BOLUS (SEPSIS)
1000.0000 mL | Freq: Once | INTRAVENOUS | Status: AC
  Administered 2021-02-27: 1000 mL via INTRAVENOUS

## 2021-02-27 MED ORDER — VANCOMYCIN HCL IN DEXTROSE 1-5 GM/200ML-% IV SOLN
1000.0000 mg | Freq: Once | INTRAVENOUS | Status: AC
  Administered 2021-02-27: 1000 mg via INTRAVENOUS
  Filled 2021-02-27: qty 200

## 2021-02-27 MED ORDER — DOXYCYCLINE HYCLATE 100 MG PO CAPS: 100.0000 mg | ORAL_CAPSULE | Freq: Two times a day (BID) | ORAL | 0 refills | Status: DC

## 2021-02-27 MED ORDER — LIDOCAINE HCL (PF) 1 % IJ SOLN
5.0000 mL | Freq: Once | INTRAMUSCULAR | Status: AC
  Administered 2021-02-27: 5 mL
  Filled 2021-02-27: qty 30

## 2021-02-27 NOTE — ED Triage Notes (Signed)
Pt complains of wound to left wrist x 3 days. Started as small pimple, grew to large boil. He tried to pop it and now reports ulcerated area.
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
## 2021-02-27 NOTE — ED Notes (Signed)
Bordered gauze to cover wound to left wrist

## 2021-03-01 ENCOUNTER — Other Ambulatory Visit: Payer: Self-pay

## 2021-03-01 ENCOUNTER — Encounter (HOSPITAL_COMMUNITY): Payer: Self-pay

## 2021-03-01 ENCOUNTER — Emergency Department (HOSPITAL_COMMUNITY)
Admission: EM | Admit: 2021-03-01 | Discharge: 2021-03-01 | Disposition: A | Discharge status: Home / Self Care | Payer: Medicaid Other | Attending: Emergency Medicine | Admitting: Emergency Medicine

## 2021-03-01 DIAGNOSIS — Z85028 Personal history of other malignant neoplasm of stomach: Secondary | ICD-10-CM | POA: Insufficient documentation

## 2021-03-01 DIAGNOSIS — F17210 Nicotine dependence, cigarettes, uncomplicated: Secondary | ICD-10-CM | POA: Insufficient documentation

## 2021-03-01 DIAGNOSIS — Z5189 Encounter for other specified aftercare: Secondary | ICD-10-CM

## 2021-03-01 DIAGNOSIS — R000 Tachycardia, unspecified: Secondary | ICD-10-CM | POA: Insufficient documentation

## 2021-03-01 DIAGNOSIS — Z4800 Encounter for change or removal of nonsurgical wound dressing: Secondary | ICD-10-CM | POA: Diagnosis not present

## 2021-03-01 DIAGNOSIS — L539 Erythematous condition, unspecified: Secondary | ICD-10-CM | POA: Insufficient documentation

## 2021-03-01 MED ORDER — MUPIROCIN CALCIUM 2 % EX CREA: 1.0000 "application " | TOPICAL_CREAM | Freq: Two times a day (BID) | CUTANEOUS | 0 refills | Status: DC

## 2021-03-01 NOTE — Discharge Instructions (Addendum)
Apply mupirocin cream 2x daily as needed. Continue the antibiotics until finished. Keep clean, wash with soap and water. If it worsens return back to the ED

## 2021-03-01 NOTE — ED Triage Notes (Signed)
Pt reports here for wound re check to left forearm. Redness noted with purulent drainage
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
## 2021-03-04 LAB — CULTURE, BLOOD (SINGLE)
Culture: NO GROWTH
Special Requests: ADEQUATE

## 2022-11-15 ENCOUNTER — Ambulatory Visit
Admission: RE | Admit: 2022-11-15 | Discharge: 2022-11-15 | Disposition: A | Discharge status: Home / Self Care | Payer: 59 | Source: Ambulatory Visit | Attending: Family Medicine | Admitting: Family Medicine

## 2022-11-15 VITALS — BP 152/106 | HR 102 | Temp 98.0°F | Resp 18

## 2022-11-15 DIAGNOSIS — M5412 Radiculopathy, cervical region: Secondary | ICD-10-CM | POA: Diagnosis not present

## 2022-11-15 MED ORDER — PREDNISONE 10 MG (21) PO TBPK: ORAL_TABLET | Freq: Every day | ORAL | 0 refills | Status: DC

## 2022-11-15 MED ORDER — CYCLOBENZAPRINE HCL 10 MG PO TABS: 10.0000 mg | ORAL_TABLET | Freq: Two times a day (BID) | ORAL | 0 refills | Status: DC | PRN

## 2022-11-15 MED ORDER — NAPROXEN 500 MG PO TABS: 500.0000 mg | ORAL_TABLET | Freq: Two times a day (BID) | ORAL | 0 refills | Status: DC

## 2022-11-15 NOTE — ED Triage Notes (Signed)
Pt states he slept in a chair over 2 weeks ago and developed right side neck pain. The pain gradually radiated into his upper back and down his right arm.

## 2022-11-15 NOTE — Discharge Instructions (Signed)
If medication was prescribed, stop by the pharmacy to pick up your prescriptions.  For your back pain, Take 1000 mg 2-3 times a day, take muscle relaxer (Flexeril) 2-3 times a day, take Naprosyn twice a day,  as needed for pain. Consider stopping by the pharmacy or dollar store to pick up some Lidocaine patches. Apply for 12 hours and then remove.   Watch for worsening symptoms such as an increasing weakness or loss of sensation in your arms or legs, increasing pain and/or the loss of bladder or bowel function. Should any of these occur, go to the emergency department immediately.
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
## 2022-11-29 ENCOUNTER — Emergency Department (HOSPITAL_COMMUNITY)
Admission: EM | Admit: 2022-11-29 | Discharge: 2022-11-30 | Disposition: A | Discharge status: Home / Self Care | Payer: 59 | Attending: Emergency Medicine | Admitting: Emergency Medicine

## 2022-11-29 ENCOUNTER — Other Ambulatory Visit: Payer: Self-pay

## 2022-11-29 DIAGNOSIS — M62838 Other muscle spasm: Secondary | ICD-10-CM | POA: Insufficient documentation

## 2022-11-29 DIAGNOSIS — M542 Cervicalgia: Secondary | ICD-10-CM | POA: Diagnosis present

## 2022-11-29 DIAGNOSIS — Z85028 Personal history of other malignant neoplasm of stomach: Secondary | ICD-10-CM | POA: Insufficient documentation

## 2022-11-29 MED ORDER — KETOROLAC TROMETHAMINE 60 MG/2ML IM SOLN
30.0000 mg | Freq: Once | INTRAMUSCULAR | Status: AC
  Administered 2022-11-30: 30 mg via INTRAMUSCULAR
  Filled 2022-11-29: qty 2
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
## 2022-11-29 NOTE — ED Triage Notes (Signed)
Pt c/o R sided neck/shoulder pain that radiates down arm x 3 weeks. Was seen at [facility] and diagnosed with pinched nerve at that time. Prescribed steroids and muscle relaxers, which he finished taking a few days ago. Has been taking aleve for pain without relief. States that the pain takes his breath away. Pt tearful during triage.

## 2022-11-30 DIAGNOSIS — M62838 Other muscle spasm: Secondary | ICD-10-CM | POA: Diagnosis not present

## 2022-11-30 MED ORDER — NAPROXEN 500 MG PO TABS: 500.0000 mg | ORAL_TABLET | Freq: Two times a day (BID) | ORAL | 0 refills | Status: DC

## 2022-11-30 MED ORDER — CYCLOBENZAPRINE HCL 10 MG PO TABS
5.0000 mg | ORAL_TABLET | Freq: Once | ORAL | Status: AC
  Administered 2022-11-30: 5 mg via ORAL
  Filled 2022-11-30: qty 1

## 2022-11-30 MED ORDER — CYCLOBENZAPRINE HCL 10 MG PO TABS: 10.0000 mg | ORAL_TABLET | Freq: Every day | ORAL | 0 refills | Status: DC

## 2022-11-30 NOTE — Discharge Instructions (Signed)
In addition to your prescriptions, you may use over-the-counter  Acetaminophen (Tylenol), topical muscle creams such as SalonPas, [employer], Bengay, etc. Please stretch, apply ice or heat (whichever helps), and have massage therapy for additional assistance.

## 2024-03-29 ENCOUNTER — Ambulatory Visit
Admission: EM | Admit: 2024-03-29 | Discharge: 2024-03-29 | Disposition: A | Discharge status: Home / Self Care | Attending: Emergency Medicine | Admitting: Emergency Medicine

## 2024-03-29 ENCOUNTER — Encounter: Payer: Self-pay | Admitting: *Deleted

## 2024-03-29 ENCOUNTER — Other Ambulatory Visit: Payer: Self-pay

## 2024-03-29 DIAGNOSIS — H578A3 Foreign body sensation, bilateral eyes: Secondary | ICD-10-CM | POA: Diagnosis not present

## 2024-03-29 DIAGNOSIS — H1033 Unspecified acute conjunctivitis, bilateral: Secondary | ICD-10-CM | POA: Diagnosis not present

## 2024-03-29 MED ORDER — OFLOXACIN 0.3 % OP SOLN: 1.0000 [drp] | Freq: Three times a day (TID) | OPHTHALMIC | 0 refills | Status: AC | PRN

## 2024-03-29 NOTE — Discharge Instructions (Signed)
 Ofloxacin  eye drops in both eyes, three times daily, for 5-6 days in a row
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
## 2024-03-29 NOTE — ED Triage Notes (Signed)
 Yesterday states he was weed eating and hit an ant hill and the eggs, ants and dirt went into his face and eyes. He rinsed his eyes and now is having drainage from both eyes. Also reports he was stung multiple times by yellow jackets.
